# Patient Record
Sex: Male | Born: 1985 | Race: White | Hispanic: No | Marital: Single | Smoking: Former smoker
Health system: Southern US, Community
[De-identification: ages and names within clinical notes are randomized; demographics above are authoritative.]

## PROBLEM LIST (undated history)

## (undated) DIAGNOSIS — F419 Anxiety disorder, unspecified: Secondary | ICD-10-CM

## (undated) DIAGNOSIS — E079 Disorder of thyroid, unspecified: Secondary | ICD-10-CM

## (undated) HISTORY — DX: Disorder of thyroid, unspecified: E07.9

## (undated) HISTORY — DX: Anxiety disorder, unspecified: F41.9

## (undated) HISTORY — PX: FRACTURE SURGERY: SHX138

---
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
## 2011-03-17 ENCOUNTER — Emergency Department: Payer: Self-pay | Admitting: Emergency Medicine

## 2011-03-17 ENCOUNTER — Ambulatory Visit: Payer: Self-pay

## 2011-03-18 ENCOUNTER — Emergency Department: Payer: Self-pay | Admitting: *Deleted

## 2011-03-19 ENCOUNTER — Ambulatory Visit: Payer: Self-pay

## 2015-03-16 ENCOUNTER — Encounter: Payer: Self-pay | Admitting: *Deleted

## 2015-03-16 ENCOUNTER — Emergency Department: Payer: BLUE CROSS/BLUE SHIELD

## 2015-03-16 ENCOUNTER — Emergency Department
Admission: EM | Admit: 2015-03-16 | Discharge: 2015-03-16 | Disposition: A | Discharge status: Home / Self Care | Payer: BLUE CROSS/BLUE SHIELD | Attending: Emergency Medicine | Admitting: Emergency Medicine

## 2015-03-16 DIAGNOSIS — R1909 Other intra-abdominal and pelvic swelling, mass and lump: Secondary | ICD-10-CM

## 2015-03-16 DIAGNOSIS — L089 Local infection of the skin and subcutaneous tissue, unspecified: Secondary | ICD-10-CM | POA: Insufficient documentation

## 2015-03-16 DIAGNOSIS — R1031 Right lower quadrant pain: Secondary | ICD-10-CM | POA: Insufficient documentation

## 2015-03-16 DIAGNOSIS — R1900 Intra-abdominal and pelvic swelling, mass and lump, unspecified site: Secondary | ICD-10-CM | POA: Insufficient documentation

## 2015-03-16 DIAGNOSIS — B9689 Other specified bacterial agents as the cause of diseases classified elsewhere: Secondary | ICD-10-CM

## 2015-03-16 LAB — BASIC METABOLIC PANEL
Anion gap: 6 (ref 5–15)
BUN: 18 mg/dL (ref 6–20)
CHLORIDE: 101 mmol/L (ref 101–111)
CO2: 30 mmol/L (ref 22–32)
Calcium: 9.1 mg/dL (ref 8.9–10.3)
Creatinine, Ser: 0.81 mg/dL (ref 0.61–1.24)
GFR calc Af Amer: >60 mL/min (ref >60)
GFR calc non Af Amer: >60 mL/min (ref >60)
GLUCOSE: 99 mg/dL (ref 65–99)
POTASSIUM: 3.6 mmol/L (ref 3.5–5.1)
SODIUM: 137 mmol/L (ref 135–145)

## 2015-03-16 LAB — CBC WITH DIFFERENTIAL/PLATELET
Basophils Absolute: 0 10*3/uL (ref 0–0.1)
Basophils Relative: 0 %
EOS PCT: 1 %
Eosinophils Absolute: 0.1 10*3/uL (ref 0–0.7)
HEMATOCRIT: 36.8 % — AB (ref 40.0–52.0)
Hemoglobin: 12.7 g/dL — ABNORMAL LOW (ref 13.0–18.0)
LYMPHS ABS: 1.8 10*3/uL (ref 1.0–3.6)
LYMPHS PCT: 14 %
MCH: 29.7 pg (ref 26.0–34.0)
MCHC: 34.4 g/dL (ref 32.0–36.0)
MCV: 86.1 fL (ref 80.0–100.0)
Monocytes Absolute: 1.2 10*3/uL — ABNORMAL HIGH (ref 0.2–1.0)
Monocytes Relative: 9 %
Neutro Abs: 9.8 10*3/uL — ABNORMAL HIGH (ref 1.4–6.5)
Neutrophils Relative %: 76 %
PLATELETS: 176 10*3/uL (ref 150–440)
RBC: 4.28 MIL/uL — AB (ref 4.40–5.90)
RDW: 12.6 % (ref 11.5–14.5)
WBC: 12.9 10*3/uL — AB (ref 3.8–10.6)

## 2015-03-16 LAB — URINALYSIS COMPLETE WITH MICROSCOPIC (ARMC ONLY)
Bilirubin Urine: NEGATIVE
Glucose, UA: 50 mg/dL — AB
Leukocytes, UA: NEGATIVE
Nitrite: NEGATIVE
PH: 5 (ref 5.0–8.0)
Protein, ur: 30 mg/dL — AB
SPECIFIC GRAVITY, URINE: 1.032 — AB (ref 1.005–1.030)
SQUAMOUS EPITHELIAL / LPF: NONE SEEN

## 2015-03-16 MED ORDER — HYDROMORPHONE HCL 1 MG/ML IJ SOLN
1.0000 mg | Freq: Once | INTRAMUSCULAR | Status: AC
  Administered 2015-03-16: 1 mg via INTRAVENOUS

## 2015-03-16 MED ORDER — CLINDAMYCIN HCL 150 MG PO CAPS: 150.0000 mg | ORAL_CAPSULE | Freq: Four times a day (QID) | ORAL | Status: DC

## 2015-03-16 MED ORDER — OXYCODONE-ACETAMINOPHEN 7.5-325 MG PO TABS: 1.0000 | ORAL_TABLET | Freq: Four times a day (QID) | ORAL | Status: DC | PRN

## 2015-03-16 MED ORDER — SULFAMETHOXAZOLE-TRIMETHOPRIM 800-160 MG PO TABS
1.0000 | ORAL_TABLET | Freq: Two times a day (BID) | ORAL | Status: DC
Start: 2015-03-16 — End: 2015-03-16

## 2015-03-16 MED ORDER — CLINDAMYCIN PHOSPHATE 600 MG/50ML IV SOLN
600.0000 mg | Freq: Once | INTRAVENOUS | Status: AC
  Administered 2015-03-16: 600 mg via INTRAVENOUS
  Filled 2015-03-16: qty 50

## 2015-03-16 MED ORDER — HYDROMORPHONE HCL 1 MG/ML IJ SOLN
INTRAMUSCULAR | Status: AC
  Administered 2015-03-16: 1 mg via INTRAVENOUS
  Filled 2015-03-16: qty 1

## 2015-03-16 MED ORDER — IOHEXOL 300 MG/ML  SOLN
100.0000 mL | Freq: Once | INTRAMUSCULAR | Status: AC | PRN
  Administered 2015-03-16: 100 mL via INTRAVENOUS
  Filled 2015-03-16: qty 100

## 2015-03-16 NOTE — Discharge Instructions (Signed)
Take medication and follow discharge care instructions.

## 2015-03-16 NOTE — ED Notes (Signed)
Patient c/o abscess in right groin for two days.
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
## 2015-03-16 NOTE — ED Notes (Signed)
Pt presents with abscess to upper pubic area. States that it came up on Friday morning and then he "messed with it." Pt alert & oriented with warm, dry skin.

## 2015-03-18 ENCOUNTER — Encounter: Payer: Self-pay | Admitting: Emergency Medicine

## 2015-03-18 ENCOUNTER — Emergency Department
Admission: EM | Admit: 2015-03-18 | Discharge: 2015-03-18 | Disposition: A | Discharge status: Home / Self Care | Payer: BLUE CROSS/BLUE SHIELD | Attending: Emergency Medicine | Admitting: Emergency Medicine

## 2015-03-18 DIAGNOSIS — Z792 Long term (current) use of antibiotics: Secondary | ICD-10-CM | POA: Diagnosis not present

## 2015-03-18 DIAGNOSIS — L03314 Cellulitis of groin: Secondary | ICD-10-CM | POA: Diagnosis not present

## 2015-03-18 DIAGNOSIS — Z4801 Encounter for change or removal of surgical wound dressing: Secondary | ICD-10-CM | POA: Diagnosis present

## 2015-03-18 MED ORDER — OXYCODONE-ACETAMINOPHEN 5-325 MG PO TABS: 1.0000 | ORAL_TABLET | Freq: Four times a day (QID) | ORAL | Status: DC | PRN

## 2015-03-18 NOTE — ED Notes (Addendum)
Pt here to have inguinal abscess reassessed. Pt has been taking antibiotics and pain meds as prescribed.
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
## 2015-03-18 NOTE — Discharge Instructions (Signed)

## 2015-03-18 NOTE — ED Notes (Signed)
Discussed discharge instructions, prescriptions, and follow-up care with patient. No questions or concerns at this time. Pt stable at discharge.  

## 2016-07-04 ENCOUNTER — Inpatient Hospital Stay: Payer: BLUE CROSS/BLUE SHIELD

## 2016-07-04 ENCOUNTER — Inpatient Hospital Stay
Admission: EM | Admit: 2016-07-04 | Discharge: 2016-07-07 | DRG: 494 | Disposition: A | Discharge status: Home / Self Care | Payer: BLUE CROSS/BLUE SHIELD | Attending: Orthopedic Surgery | Admitting: Orthopedic Surgery

## 2016-07-04 ENCOUNTER — Emergency Department: Payer: BLUE CROSS/BLUE SHIELD

## 2016-07-04 DIAGNOSIS — S82851B Displaced trimalleolar fracture of right lower leg, initial encounter for open fracture type I or II: Secondary | ICD-10-CM | POA: Diagnosis present

## 2016-07-04 DIAGNOSIS — Z720 Tobacco use: Secondary | ICD-10-CM | POA: Diagnosis not present

## 2016-07-04 DIAGNOSIS — T148XXA Other injury of unspecified body region, initial encounter: Secondary | ICD-10-CM

## 2016-07-04 DIAGNOSIS — S82841B Displaced bimalleolar fracture of right lower leg, initial encounter for open fracture type I or II: Secondary | ICD-10-CM

## 2016-07-04 DIAGNOSIS — Z9889 Other specified postprocedural states: Secondary | ICD-10-CM

## 2016-07-04 DIAGNOSIS — Z419 Encounter for procedure for purposes other than remedying health state, unspecified: Secondary | ICD-10-CM

## 2016-07-04 DIAGNOSIS — Z8781 Personal history of (healed) traumatic fracture: Secondary | ICD-10-CM

## 2016-07-04 DIAGNOSIS — M25571 Pain in right ankle and joints of right foot: Secondary | ICD-10-CM | POA: Diagnosis present

## 2016-07-04 DIAGNOSIS — S82891B Other fracture of right lower leg, initial encounter for open fracture type I or II: Secondary | ICD-10-CM | POA: Diagnosis present

## 2016-07-04 LAB — TYPE AND SCREEN
ABO/RH(D): O POS
ANTIBODY SCREEN: NEGATIVE

## 2016-07-04 LAB — COMPREHENSIVE METABOLIC PANEL
ALBUMIN: 4.6 g/dL (ref 3.5–5.0)
ALT: 23 U/L (ref 17–63)
AST: 51 U/L — ABNORMAL HIGH (ref 15–41)
Alkaline Phosphatase: 45 U/L (ref 38–126)
Anion gap: 8 (ref 5–15)
BUN: 14 mg/dL (ref 6–20)
CHLORIDE: 103 mmol/L (ref 101–111)
CO2: 26 mmol/L (ref 22–32)
Calcium: 8.8 mg/dL — ABNORMAL LOW (ref 8.9–10.3)
Creatinine, Ser: 0.74 mg/dL (ref 0.61–1.24)
GFR calc Af Amer: >60 mL/min (ref >60)
Glucose, Bld: 100 mg/dL — ABNORMAL HIGH (ref 65–99)
POTASSIUM: 3.3 mmol/L — AB (ref 3.5–5.1)
SODIUM: 137 mmol/L (ref 135–145)
TOTAL PROTEIN: 7.5 g/dL (ref 6.5–8.1)
Total Bilirubin: 0.5 mg/dL (ref 0.3–1.2)

## 2016-07-04 LAB — CBC WITH DIFFERENTIAL/PLATELET
BASOS ABS: 0 10*3/uL (ref 0–0.1)
Basophils Relative: 0 %
EOS PCT: 0 %
Eosinophils Absolute: 0 10*3/uL (ref 0–0.7)
HEMATOCRIT: 38.7 % — AB (ref 40.0–52.0)
Hemoglobin: 13.5 g/dL (ref 13.0–18.0)
LYMPHS ABS: 1.5 10*3/uL (ref 1.0–3.6)
LYMPHS PCT: 11 %
MCH: 30.1 pg (ref 26.0–34.0)
MCHC: 34.8 g/dL (ref 32.0–36.0)
MCV: 86.4 fL (ref 80.0–100.0)
MONO ABS: 0.7 10*3/uL (ref 0.2–1.0)
Monocytes Relative: 5 %
NEUTROS ABS: 11.5 10*3/uL — AB (ref 1.4–6.5)
Neutrophils Relative %: 84 %
PLATELETS: 254 10*3/uL (ref 150–440)
RBC: 4.48 MIL/uL (ref 4.40–5.90)
RDW: 12.6 % (ref 11.5–14.5)
WBC: 13.8 10*3/uL — ABNORMAL HIGH (ref 3.8–10.6)

## 2016-07-04 LAB — PROTIME-INR
INR: 1.13
PROTHROMBIN TIME: 14.6 s (ref 11.4–15.2)

## 2016-07-04 MED ORDER — MORPHINE SULFATE (PF) 2 MG/ML IV SOLN
2.0000 mg | INTRAVENOUS | Status: DC | PRN
  Administered 2016-07-04 – 2016-07-05 (×4): 2 mg via INTRAVENOUS
  Filled 2016-07-04 (×4): qty 1

## 2016-07-04 MED ORDER — FENTANYL CITRATE (PF) 100 MCG/2ML IJ SOLN
50.0000 ug | INTRAMUSCULAR | Status: DC | PRN
  Administered 2016-07-04: 50 ug via NASAL
  Filled 2016-07-04: qty 2

## 2016-07-04 MED ORDER — MORPHINE SULFATE (PF) 4 MG/ML IV SOLN
4.0000 mg | Freq: Once | INTRAVENOUS | Status: AC
  Administered 2016-07-04: 4 mg via INTRAVENOUS
  Filled 2016-07-04: qty 1

## 2016-07-04 MED ORDER — SODIUM CHLORIDE 0.9 % IV BOLUS (SEPSIS)
1000.0000 mL | Freq: Once | INTRAVENOUS | Status: AC
Start: 2016-07-04 — End: 2016-07-04
  Administered 2016-07-04: 1000 mL via INTRAVENOUS

## 2016-07-04 MED ORDER — SODIUM CHLORIDE 0.9 % IV SOLN
INTRAVENOUS | Status: DC
  Administered 2016-07-05 (×3): via INTRAVENOUS

## 2016-07-04 MED ORDER — CEFAZOLIN IN D5W 1 GM/50ML IV SOLN
1.0000 g | Freq: Once | INTRAVENOUS | Status: AC
  Administered 2016-07-04: 1 g via INTRAVENOUS
  Filled 2016-07-04: qty 50

## 2016-07-04 MED ORDER — HYDROCODONE-ACETAMINOPHEN 5-325 MG PO TABS
1.0000 | ORAL_TABLET | Freq: Four times a day (QID) | ORAL | Status: DC | PRN
  Administered 2016-07-04 – 2016-07-06 (×6): 2 via ORAL
  Administered 2016-07-07: 1 via ORAL
  Filled 2016-07-04 (×8): qty 2

## 2016-07-04 MED ORDER — CEFAZOLIN SODIUM-DEXTROSE 2-4 GM/100ML-% IV SOLN
2.0000 g | Freq: Four times a day (QID) | INTRAVENOUS | Status: DC
  Filled 2016-07-04 (×3): qty 100

## 2016-07-04 NOTE — ED Notes (Signed)
Report given to floor RN, RN made aware of the bleeding from the cast and the pain and burning pt is describing
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
## 2016-07-04 NOTE — ED Notes (Signed)
Dr Rosita Kea called about the pain and amount of bleeding noted to the casting of the rt ankle, Dr Rosita Kea reports is normal for his injury and to apply ice to alleviate.
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
## 2016-07-05 ENCOUNTER — Encounter
Admission: EM | Disposition: A | Discharge status: Home / Self Care | Payer: Self-pay | Source: Home / Self Care | Attending: Orthopedic Surgery

## 2016-07-05 ENCOUNTER — Inpatient Hospital Stay: Payer: BLUE CROSS/BLUE SHIELD

## 2016-07-05 ENCOUNTER — Inpatient Hospital Stay: Payer: BLUE CROSS/BLUE SHIELD | Admitting: Anesthesiology

## 2016-07-05 HISTORY — PX: ORIF ANKLE FRACTURE: SHX5408

## 2016-07-05 HISTORY — PX: I & D EXTREMITY: SHX5045

## 2016-07-05 LAB — SURGICAL PCR SCREEN
MRSA, PCR: NEGATIVE
STAPHYLOCOCCUS AUREUS: POSITIVE — AB

## 2016-07-05 SURGERY — OPEN REDUCTION INTERNAL FIXATION (ORIF) ANKLE FRACTURE
Anesthesia: General | Laterality: Right

## 2016-07-05 MED ORDER — MIDAZOLAM HCL 2 MG/2ML IJ SOLN
INTRAMUSCULAR | Status: DC | PRN
  Administered 2016-07-05: 2 mg via INTRAVENOUS

## 2016-07-05 MED ORDER — ZOLPIDEM TARTRATE 5 MG PO TABS: 5.0000 mg | ORAL_TABLET | Freq: Every evening | ORAL | Status: DC | PRN

## 2016-07-05 MED ORDER — CEFAZOLIN SODIUM-DEXTROSE 2-3 GM-% IV SOLR
2.0000 g | Freq: Four times a day (QID) | INTRAVENOUS | Status: DC
  Filled 2016-07-05 (×4): qty 50

## 2016-07-05 MED ORDER — ONDANSETRON HCL 4 MG/2ML IJ SOLN
INTRAMUSCULAR | Status: DC | PRN
  Administered 2016-07-05 (×2): 4 mg via INTRAVENOUS

## 2016-07-05 MED ORDER — GENTAMICIN IN SALINE 1.6-0.9 MG/ML-% IV SOLN
80.0000 mg | Freq: Three times a day (TID) | INTRAVENOUS | Status: DC
  Filled 2016-07-05 (×3): qty 50

## 2016-07-05 MED ORDER — PROPOFOL 10 MG/ML IV BOLUS
INTRAVENOUS | Status: DC | PRN
  Administered 2016-07-05: 150 mg via INTRAVENOUS
  Administered 2016-07-05: 50 mg via INTRAVENOUS

## 2016-07-05 MED ORDER — METOCLOPRAMIDE HCL 10 MG PO TABS
5.0000 mg | ORAL_TABLET | Freq: Three times a day (TID) | ORAL | Status: DC | PRN
  Filled 2016-07-05: qty 1

## 2016-07-05 MED ORDER — OXYCODONE HCL 5 MG PO TABS
5.0000 mg | ORAL_TABLET | ORAL | Status: DC | PRN
  Administered 2016-07-06 – 2016-07-07 (×4): 10 mg via ORAL
  Filled 2016-07-05 (×4): qty 2

## 2016-07-05 MED ORDER — FENTANYL CITRATE (PF) 100 MCG/2ML IJ SOLN
INTRAMUSCULAR | Status: AC
  Filled 2016-07-05: qty 2

## 2016-07-05 MED ORDER — SODIUM CHLORIDE 0.9 % IV SOLN
INTRAVENOUS | Status: DC
  Administered 2016-07-05: 20:00:00 via INTRAVENOUS

## 2016-07-05 MED ORDER — KETOROLAC TROMETHAMINE 15 MG/ML IJ SOLN
15.0000 mg | Freq: Four times a day (QID) | INTRAMUSCULAR | Status: AC
  Administered 2016-07-05 – 2016-07-06 (×3): 15 mg via INTRAVENOUS
  Filled 2016-07-05 (×3): qty 1

## 2016-07-05 MED ORDER — DEXAMETHASONE SODIUM PHOSPHATE 10 MG/ML IJ SOLN
INTRAMUSCULAR | Status: DC | PRN
  Administered 2016-07-05: 10 mg via INTRAVENOUS

## 2016-07-05 MED ORDER — DEXTROSE 5 % IV SOLN
2.0000 g | Freq: Four times a day (QID) | INTRAVENOUS | Status: AC
  Administered 2016-07-05 – 2016-07-06 (×6): 2 g via INTRAVENOUS
  Filled 2016-07-05 (×7): qty 2000

## 2016-07-05 MED ORDER — ACETAMINOPHEN 650 MG RE SUPP: 650.0000 mg | Freq: Four times a day (QID) | RECTAL | Status: DC | PRN

## 2016-07-05 MED ORDER — SUGAMMADEX SODIUM 500 MG/5ML IV SOLN
INTRAVENOUS | Status: DC | PRN
  Administered 2016-07-05: 149.6 mg via INTRAVENOUS

## 2016-07-05 MED ORDER — HYDROMORPHONE HCL 1 MG/ML IJ SOLN
INTRAMUSCULAR | Status: AC
  Filled 2016-07-05: qty 1

## 2016-07-05 MED ORDER — ROCURONIUM BROMIDE 50 MG/5ML IV SOLN
INTRAVENOUS | Status: AC
  Filled 2016-07-05: qty 1

## 2016-07-05 MED ORDER — BISACODYL 5 MG PO TBEC: 5.0000 mg | DELAYED_RELEASE_TABLET | Freq: Every day | ORAL | Status: DC | PRN

## 2016-07-05 MED ORDER — ONDANSETRON HCL 4 MG/2ML IJ SOLN: 4.0000 mg | Freq: Four times a day (QID) | INTRAMUSCULAR | Status: DC | PRN

## 2016-07-05 MED ORDER — ACETAMINOPHEN 10 MG/ML IV SOLN
INTRAVENOUS | Status: DC | PRN
  Administered 2016-07-05: 1000 mg via INTRAVENOUS

## 2016-07-05 MED ORDER — DIPHENHYDRAMINE HCL 12.5 MG/5ML PO ELIX: 12.5000 mg | ORAL_SOLUTION | ORAL | Status: DC | PRN

## 2016-07-05 MED ORDER — ACETAMINOPHEN 10 MG/ML IV SOLN
INTRAVENOUS | Status: AC
  Filled 2016-07-05: qty 100

## 2016-07-05 MED ORDER — METHOCARBAMOL 500 MG PO TABS: 500.0000 mg | ORAL_TABLET | Freq: Four times a day (QID) | ORAL | Status: DC | PRN

## 2016-07-05 MED ORDER — HYDROMORPHONE HCL 1 MG/ML IJ SOLN
1.0000 mg | INTRAMUSCULAR | Status: DC | PRN
  Administered 2016-07-05 (×2): 1 mg via INTRAVENOUS
  Filled 2016-07-05 (×2): qty 1

## 2016-07-05 MED ORDER — DEXTROSE 5 % IV SOLN
2.0000 g | Freq: Four times a day (QID) | INTRAVENOUS | Status: DC
  Administered 2016-07-05 (×3): 2 g via INTRAVENOUS
  Filled 2016-07-05 (×7): qty 2000

## 2016-07-05 MED ORDER — METOCLOPRAMIDE HCL 5 MG/ML IJ SOLN: 5.0000 mg | Freq: Three times a day (TID) | INTRAMUSCULAR | Status: DC | PRN

## 2016-07-05 MED ORDER — ROCURONIUM BROMIDE 100 MG/10ML IV SOLN
INTRAVENOUS | Status: DC | PRN
  Administered 2016-07-05: 10 mg via INTRAVENOUS
  Administered 2016-07-05: 30 mg via INTRAVENOUS

## 2016-07-05 MED ORDER — PROPOFOL 10 MG/ML IV BOLUS
INTRAVENOUS | Status: AC
  Filled 2016-07-05: qty 20

## 2016-07-05 MED ORDER — ONDANSETRON HCL 4 MG/2ML IJ SOLN
INTRAMUSCULAR | Status: AC
  Filled 2016-07-05: qty 2

## 2016-07-05 MED ORDER — KETOROLAC TROMETHAMINE 30 MG/ML IJ SOLN
30.0000 mg | Freq: Once | INTRAMUSCULAR | Status: AC
  Administered 2016-07-05: 30 mg via INTRAVENOUS
  Filled 2016-07-05: qty 1

## 2016-07-05 MED ORDER — KETOROLAC TROMETHAMINE 30 MG/ML IJ SOLN
30.0000 mg | Freq: Four times a day (QID) | INTRAMUSCULAR | Status: DC | PRN
  Administered 2016-07-05: 30 mg via INTRAVENOUS
  Filled 2016-07-05 (×2): qty 1

## 2016-07-05 MED ORDER — ONDANSETRON HCL 4 MG PO TABS: 4.0000 mg | ORAL_TABLET | Freq: Four times a day (QID) | ORAL | Status: DC | PRN

## 2016-07-05 MED ORDER — HYDROMORPHONE HCL 1 MG/ML IJ SOLN
1.0000 mg | Freq: Once | INTRAMUSCULAR | Status: AC
  Administered 2016-07-05: 1 mg via INTRAVENOUS
  Filled 2016-07-05: qty 1

## 2016-07-05 MED ORDER — DEXAMETHASONE SODIUM PHOSPHATE 10 MG/ML IJ SOLN
INTRAMUSCULAR | Status: AC
  Filled 2016-07-05: qty 1

## 2016-07-05 MED ORDER — FENTANYL CITRATE (PF) 100 MCG/2ML IJ SOLN
INTRAMUSCULAR | Status: DC | PRN
  Administered 2016-07-05: 50 ug via INTRAVENOUS
  Administered 2016-07-05: 100 ug via INTRAVENOUS
  Administered 2016-07-05: 50 ug via INTRAVENOUS

## 2016-07-05 MED ORDER — SUCCINYLCHOLINE CHLORIDE 20 MG/ML IJ SOLN
INTRAMUSCULAR | Status: DC | PRN
  Administered 2016-07-05: 80 mg via INTRAVENOUS

## 2016-07-05 MED ORDER — MIDAZOLAM HCL 2 MG/2ML IJ SOLN
INTRAMUSCULAR | Status: AC
  Filled 2016-07-05: qty 2

## 2016-07-05 MED ORDER — SUGAMMADEX SODIUM 200 MG/2ML IV SOLN
INTRAVENOUS | Status: AC
  Filled 2016-07-05: qty 2

## 2016-07-05 MED ORDER — ACETAMINOPHEN 325 MG PO TABS: 650.0000 mg | ORAL_TABLET | Freq: Four times a day (QID) | ORAL | Status: DC | PRN

## 2016-07-05 MED ORDER — GENTAMICIN IN SALINE 1.6-0.9 MG/ML-% IV SOLN
80.0000 mg | Freq: Three times a day (TID) | INTRAVENOUS | Status: DC
  Administered 2016-07-05 – 2016-07-06 (×3): 80 mg via INTRAVENOUS
  Filled 2016-07-05 (×7): qty 50

## 2016-07-05 MED ORDER — CEFAZOLIN SODIUM-DEXTROSE 2-4 GM/100ML-% IV SOLN
2.0000 g | Freq: Four times a day (QID) | INTRAVENOUS | Status: DC
  Filled 2016-07-05 (×4): qty 100

## 2016-07-05 MED ORDER — DOCUSATE SODIUM 100 MG PO CAPS
100.0000 mg | ORAL_CAPSULE | Freq: Two times a day (BID) | ORAL | Status: DC
  Administered 2016-07-05 – 2016-07-06 (×3): 100 mg via ORAL
  Filled 2016-07-05 (×3): qty 1

## 2016-07-05 MED ORDER — MAGNESIUM CITRATE PO SOLN
1.0000 | Freq: Once | ORAL | Status: DC | PRN
  Filled 2016-07-05: qty 296

## 2016-07-05 MED ORDER — ONDANSETRON HCL 4 MG/2ML IJ SOLN: 4.0000 mg | Freq: Once | INTRAMUSCULAR | Status: DC | PRN

## 2016-07-05 MED ORDER — FENTANYL CITRATE (PF) 100 MCG/2ML IJ SOLN: 25.0000 ug | INTRAMUSCULAR | Status: DC | PRN

## 2016-07-05 MED ORDER — HYDROMORPHONE HCL 1 MG/ML IJ SOLN
INTRAMUSCULAR | Status: DC | PRN
  Administered 2016-07-05 (×4): 0.5 mg via INTRAVENOUS

## 2016-07-05 MED ORDER — LIDOCAINE HCL (CARDIAC) 20 MG/ML IV SOLN
INTRAVENOUS | Status: DC | PRN
  Administered 2016-07-05: 50 mg via INTRAVENOUS

## 2016-07-05 MED ORDER — METHOCARBAMOL 1000 MG/10ML IJ SOLN
500.0000 mg | Freq: Four times a day (QID) | INTRAVENOUS | Status: DC | PRN
  Filled 2016-07-05: qty 5

## 2016-07-05 MED ORDER — MAGNESIUM HYDROXIDE 400 MG/5ML PO SUSP
30.0000 mL | Freq: Every day | ORAL | Status: DC | PRN
  Administered 2016-07-05: 30 mL via ORAL
  Filled 2016-07-05: qty 30

## 2016-07-05 MED ORDER — HYDROMORPHONE HCL 1 MG/ML IJ SOLN: 1.0000 mg | INTRAMUSCULAR | Status: DC | PRN

## 2016-07-05 SURGICAL SUPPLY — 87 items
BANDAGE ACE 4X5 VEL STRL LF (GAUZE/BANDAGES/DRESSINGS) IMPLANT
BIT DRILL 2.5X2.75 QC CALB (BIT) ×2 IMPLANT
BIT DRILL 2.9 CANN QC NONSTRL (BIT) ×2 IMPLANT
BNDG ESMARK 4X12 TAN STRL LF (GAUZE/BANDAGES/DRESSINGS) ×2 IMPLANT
CANISTER SUCT 1200ML W/VALVE (MISCELLANEOUS) ×4 IMPLANT
CHLORAPREP W/TINT 26ML (MISCELLANEOUS) IMPLANT
CUFF TOURN 18 STER (MISCELLANEOUS) IMPLANT
CUFF TOURN 24 STER (MISCELLANEOUS) ×2 IMPLANT
CUFF TOURN 30 STER DUAL PORT (MISCELLANEOUS) IMPLANT
DRAPE C-ARM 42X70 (DRAPES) ×2 IMPLANT
DRAPE C-ARMOR (DRAPES) ×2 IMPLANT
DRAPE FLUOR MINI C-ARM 54X84 (DRAPES) IMPLANT
DRAPE INCISE IOBAN 66X45 STRL (DRAPES) IMPLANT
DRAPE SHEET LG 3/4 BI-LAMINATE (DRAPES) ×4 IMPLANT
DRAPE TABLE BACK 80X90 (DRAPES) ×2 IMPLANT
DRAPE U-SHAPE 47X51 STRL (DRAPES) ×2 IMPLANT
DRSG EMULSION OIL 3X8 NADH (GAUZE/BANDAGES/DRESSINGS) IMPLANT
DURAPREP 26ML APPLICATOR (WOUND CARE) IMPLANT
ELECT CAUTERY BLADE 6.4 (BLADE) ×2 IMPLANT
ELECT REM PT RETURN 9FT ADLT (ELECTROSURGICAL) ×2
ELECTRODE REM PT RTRN 9FT ADLT (ELECTROSURGICAL) ×1 IMPLANT
GAUZE PETRO XEROFOAM 1X8 (MISCELLANEOUS) ×2 IMPLANT
GAUZE SPONGE 4X4 12PLY STRL (GAUZE/BANDAGES/DRESSINGS) ×6 IMPLANT
GAUZE STRETCH 2X75IN STRL (MISCELLANEOUS) IMPLANT
GAUZE XEROFORM 4X4 STRL (GAUZE/BANDAGES/DRESSINGS) ×2 IMPLANT
GLOVE BIOGEL M STRL SZ7.5 (GLOVE) IMPLANT
GLOVE BIOGEL PI IND STRL 9 (GLOVE) ×2 IMPLANT
GLOVE BIOGEL PI INDICATOR 9 (GLOVE) ×2
GLOVE SURG SYN 9.0  PF PI (GLOVE) ×1
GLOVE SURG SYN 9.0 PF PI (GLOVE) ×1 IMPLANT
GOWN SRG 2XL LVL 4 RGLN SLV (GOWNS) ×1 IMPLANT
GOWN STRL NON-REIN 2XL LVL4 (GOWNS) ×1
GOWN STRL REUS W/ TWL LRG LVL3 (GOWN DISPOSABLE) ×1 IMPLANT
GOWN STRL REUS W/TWL LRG LVL3 (GOWN DISPOSABLE) ×1
HEMOVAC 400ML (MISCELLANEOUS)
K-WIRE ACE 1.6X6 (WIRE) ×6
KIT DRAIN HEMOVAC JP 7FR 400ML (MISCELLANEOUS) IMPLANT
KIT PREVENA INCISION MGT 13 (CANNISTER) ×2 IMPLANT
KIT RM TURNOVER STRD PROC AR (KITS) ×2 IMPLANT
KWIRE ACE 1.6X6 (WIRE) ×3 IMPLANT
LABEL OR SOLS (LABEL) IMPLANT
NDL SAFETY 25GX1.5 (NEEDLE) ×2 IMPLANT
NS IRRIG 1000ML POUR BTL (IV SOLUTION) ×2 IMPLANT
NS IRRIG 500ML POUR BTL (IV SOLUTION) IMPLANT
PACK EXTREMITY ARMC (MISCELLANEOUS) ×2 IMPLANT
PAD ABD DERMACEA PRESS 5X9 (GAUZE/BANDAGES/DRESSINGS) IMPLANT
PAD CAST CTTN 4X4 STRL (SOFTGOODS) IMPLANT
PAD PREP 24X41 OB/GYN DISP (PERSONAL CARE ITEMS) ×2 IMPLANT
PADDING CAST COTTON 4X4 STRL (SOFTGOODS)
PLATE ACE 100DEG 6HOLE (Plate) ×2 IMPLANT
PLATE LOCK 6H RT MED DIST TIB (Plate) ×2 IMPLANT
PULSAVAC PLUS IRRIG FAN TIP (DISPOSABLE) ×2
SCREW ACE CAN 4.0 16M (Screw) ×2 IMPLANT
SCREW ACE CAN 4.0 26M (Screw) IMPLANT
SCREW ACE CAN 4.0 34M (Screw) IMPLANT
SCREW ACE CAN 4.0 36M (Screw) ×2 IMPLANT
SCREW ACE CAN 4.0 55M (Screw) ×4 IMPLANT
SCREW CORTICAL 3.5MM  12MM (Screw) ×1 IMPLANT
SCREW CORTICAL 3.5MM  20MM (Screw) IMPLANT
SCREW CORTICAL 3.5MM  28MM (Screw) IMPLANT
SCREW CORTICAL 3.5MM  30MM (Screw) ×3 IMPLANT
SCREW CORTICAL 3.5MM  34MM (Screw) IMPLANT
SCREW CORTICAL 3.5MM 12MM (Screw) ×1 IMPLANT
SCREW CORTICAL 3.5MM 14MM (Screw) ×2 IMPLANT
SCREW CORTICAL 3.5MM 20MM (Screw) IMPLANT
SCREW CORTICAL 3.5MM 28MM (Screw) IMPLANT
SCREW CORTICAL 3.5MM 30MM (Screw) ×3 IMPLANT
SCREW CORTICAL 3.5MM 34MM (Screw) IMPLANT
SCREW CORTICAL 3.5MM 38MM (Screw) ×2 IMPLANT
SPLINT CAST 1 STEP 5X30 WHT (MISCELLANEOUS) IMPLANT
SPONGE LAP 18X18 5 PK (GAUZE/BANDAGES/DRESSINGS) IMPLANT
STAPLER SKIN PROX 35W (STAPLE) IMPLANT
STOCKINETTE STRL 6IN 960660 (GAUZE/BANDAGES/DRESSINGS) IMPLANT
STRAP SAFETY BODY (MISCELLANEOUS) IMPLANT
SUT ETHILON 3-0 FS-10 30 BLK (SUTURE)
SUT ETHILON 4-0 (SUTURE)
SUT ETHILON 4-0 FS2 18XMFL BLK (SUTURE)
SUT MNCRL AB 4-0 PS2 18 (SUTURE) IMPLANT
SUT VIC AB 0 CT1 36 (SUTURE) IMPLANT
SUT VIC AB 2-0 SH 27 (SUTURE) ×1
SUT VIC AB 2-0 SH 27XBRD (SUTURE) ×1 IMPLANT
SUT VIC AB 3-0 SH 27 (SUTURE)
SUT VIC AB 3-0 SH 27X BRD (SUTURE) IMPLANT
SUTURE EHLN 3-0 FS-10 30 BLK (SUTURE) IMPLANT
SUTURE ETHLN 4-0 FS2 18XMF BLK (SUTURE) IMPLANT
SYRINGE 10CC LL (SYRINGE) ×2 IMPLANT
TIP FAN IRRIG PULSAVAC PLUS (DISPOSABLE) ×1 IMPLANT

## 2016-07-05 NOTE — Care Management Note (Signed)
Case Management Note  Patient Details  Name: Shane Marshall MRN: [id number] Date of Birth: November 18, 1985  Subjective/Objective:  RNCM consult for equipment. ORIF and I & D today.                    Action/Plan: Following progression. Will assist as needed.   Expected Discharge Date:  07/07/16               Expected Discharge Plan:     In-House Referral:     Discharge planning Services  CM Consult  Post Acute Care Choice:  Durable Medical Equipment Choice offered to:     DME Arranged:    DME Agency:     HH Arranged:    HH Agency:     Status of Service:  In process, will continue to follow  If discussed at Long Length of Stay Meetings, dates discussed:    Additional Comments:  Marily Memos, RN 07/05/2016, 11:26 AM

## 2016-07-05 NOTE — Anesthesia Procedure Notes (Signed)
Procedure Name: Intubation Date/Time: 07/05/2016 2:31 PM Performed by: Omer Jack Pre-anesthesia Checklist: Patient identified, Patient being monitored, Timeout performed, Emergency Drugs available and Suction available Patient Re-evaluated:Patient Re-evaluated prior to inductionOxygen Delivery Method: Circle system utilized Preoxygenation: Pre-oxygenation with 100% oxygen Intubation Type: IV induction Ventilation: Mask ventilation without difficulty Laryngoscope Size: Miller and 2 Grade View: Grade I Tube type: Oral Tube size: 7.5 mm Number of attempts: 1 Placement Confirmation: ETT inserted through vocal cords under direct vision,  positive ETCO2 and breath sounds checked- equal and bilateral Secured at: 21 cm Tube secured with: Tape Dental Injury: Teeth and Oropharynx as per pre-operative assessment

## 2016-07-05 NOTE — Anesthesia Preprocedure Evaluation (Addendum)
Anesthesia Evaluation  Patient identified by MRN, date of birth, ID band Patient awake    Reviewed: Allergy & Precautions, NPO status , Patient's Chart, lab work & pertinent test results  History of Anesthesia Complications Negative for: history of anesthetic complications  Airway Mallampati: II       Dental   Pulmonary neg pulmonary ROS, Current Smoker (dips),           Cardiovascular negative cardio ROS       Neuro/Psych negative neurological ROS     GI/Hepatic negative GI ROS, Neg liver ROS,   Endo/Other  negative endocrine ROS  Renal/GU negative Renal ROS     Musculoskeletal   Abdominal   Peds  Hematology negative hematology ROS (+)   Anesthesia Other Findings   Reproductive/Obstetrics                            Anesthesia Physical Anesthesia Plan  ASA: II  Anesthesia Plan: General   Post-op Pain Management:    Induction:   Airway Management Planned: Oral ETT  Additional Equipment:   Intra-op Plan:   Post-operative Plan:   Informed Consent: I have reviewed the patients History and Physical, chart, labs and discussed the procedure including the risks, benefits and alternatives for the proposed anesthesia with the patient or authorized representative who has indicated his/her understanding and acceptance.     Plan Discussed with:   Anesthesia Plan Comments:         Anesthesia Quick Evaluation

## 2016-07-05 NOTE — Anesthesia Post-op Follow-up Note (Cosign Needed)
Anesthesia QCDR form completed.        

## 2016-07-05 NOTE — Progress Notes (Signed)
Having significant swelling to RLE,.  Loosened splint, no tight compression by splint. Able to move toes passively without severe pain. Plan ORIF later with I+D of small wound, probably with incisional wound vac post op.

## 2016-07-05 NOTE — Progress Notes (Signed)
Dr Rosita Kea notified of decreased movement in pt toes. RN instructed to loosen the ace wrap. Will continue to monitor.

## 2016-07-05 NOTE — Progress Notes (Signed)
PTS. PAIN 10 OUT OF 10. Dr. Rosita Kea notified and ordered dilaudid  q3h prn

## 2016-07-05 NOTE — Progress Notes (Signed)
Dr Rosita Kea called for ongoing pain, new orders received. Pt R toes are cool to touch, color is appropriate, cap refill less than 3 seconds. Pt can wiggle right toes with decreased strength and has full sensation/pain. Lungs clear. Skin tear to R elbow, foam applied. Pt updated on POC.

## 2016-07-05 NOTE — Op Note (Signed)
07/04/2016 - 07/05/2016  5:54 PM  PATIENT:  Shane Marshall  31 y.o. male  PRE-OPERATIVE DIAGNOSIS:  RIGHT ANKLE FRACTURE open trimalleolar  POST-OPERATIVE DIAGNOSIS:  RIGHT ANKLE FRACTURE  open trimalleolar  PROCEDURE:  Procedure(s): OPEN REDUCTION INTERNAL FIXATION (ORIF) ANKLE FRACTURE (Right) IRRIGATION AND DEBRIDEMENT EXTREMITY (Right)  SURGEON: Leitha Schuller, MD  ASSISTANTS: None  ANESTHESIA:   general  EBL:  Total I/O In: 800 [I.V.:800] Out: 25 [Blood:25]  BLOOD ADMINISTERED:none  DRAINS: none   LOCAL MEDICATIONS USED:  NONE  SPECIMEN:  No Specimen  DISPOSITION OF SPECIMEN:  N/A  COUNTS:  YES  TOURNIQUET:   Total Tourniquet Time Documented: Thigh (Right) - 99 minutes Total: Thigh (Right) - 99 minutes   IMPLANTS: Biomet locking medial tibial plate with a cannulated screws and cortical screws, posteriorly third tubular plate with multiple screws  DICTATION: .Dragon Dictation patient brought the operating room and after adequate general anesthesia was obtained the patient was placed prone. The leg was prepped and draped in sterile fashion after patient identification timeout procedures were completed the open area of the wound was extended proximally and distally proximally centimeter in each direction opening was less than a centimeter in size but there is a large muscle belly present that appeared to arise from the peroneal muscles this is debrided with use of a scalpel to get cut back to viable muscle at this point the wound was irrigated with pulsatile lavage using a dilute Betadine solution followed by set normal saline there was no gross contamination within the wound. The wound was left open and subsequently at the end of the case covered with a Provena wound VAC. Going posterior laterally about the approach was extended from a prior posterior lateral incision from a prior tibia fracture extended between the fibula and Achilles tendon with the peroneal tendon sheath  left intact and the tendons retracted laterally. The FHL was identified and elevated off the posterior aspect of the tibia with lateral views with track pressure on the distal fragment the joint appeared reduced well. A third tubular plate was then put in the appropriate position and then locked nonlocking screws were placed proximally with it and it was contoured slightly to allow for for additional compression on the distal fragment and with 3 cortical screws proximally near anatomic alignment was obtained. 2 cancellus screws were then placed through the plate directed medially and laterally that gave additional compression and essentially anatomical alignment of the articular surface on the lateral view. The attention was then returned to turned medially where the physician trimalleolar fracture was essentially also tibial distal tibia fracture with extension of the plafond with the medial malleolus also displaced Salter just putting a screw in the medial as would be inadequate and would not correct the varus deformity so a small incision was made at the level of the medial malleolus and a medial plate was slid subcutaneously under approximate 5 cm bruising Bruce skin which was left intact and not incised and a K wire inserted through the tip of this plate with a Cane screw through the medial malleolus to give some stability proximally "cortical screws were placed through the plate around the rod to get the plate close to the bone this is not entirely successful was the extensive hardware in the distal ankle and the prior rod as well as the prior interlocking screw The plate from getting all the way down to the bone but it did appear to have acceptable alignment and position after  the proximal screws were placed using technique and using lateral x-ray and going around the rod with cortical screw  PLA fracture appeared stable under stress views and the fibula was stable and so no fixation was applied to the  fibula at this point the tourniquet up and let down at slightly over 90 minutes and the wounds thoroughly irrigated. The wound was closed with 2-0 Vicryl subcutaneously and skin staples medial and lateral with a incisional wound VAC over the open wound which is left) left alone at the close the case with Xeroform 4 x 4's web roll and splint applied followed by an Ace wrap   N OF CARE: Continue in his inpatient  PATIENT DISPOSITION:  PACU - hemodynamically stable.

## 2016-07-06 ENCOUNTER — Encounter: Payer: Self-pay | Admitting: Orthopedic Surgery

## 2016-07-06 LAB — HIV ANTIBODY (ROUTINE TESTING W REFLEX): HIV SCREEN 4TH GENERATION: NONREACTIVE

## 2016-07-06 MED ORDER — HYDROCODONE-ACETAMINOPHEN 5-325 MG PO TABS: 1.0000 | ORAL_TABLET | Freq: Four times a day (QID) | ORAL | 0 refills | Status: DC | PRN

## 2016-07-06 MED ORDER — ASPIRIN EC 325 MG PO TBEC: 325.0000 mg | DELAYED_RELEASE_TABLET | Freq: Every day | ORAL | 0 refills | Status: DC

## 2016-07-06 MED ORDER — GENTAMICIN SULFATE 40 MG/ML IJ SOLN
80.0000 mg | Freq: Three times a day (TID) | INTRAVENOUS | Status: AC
  Administered 2016-07-06 – 2016-07-07 (×3): 80 mg via INTRAVENOUS
  Filled 2016-07-06 (×3): qty 2

## 2016-07-06 MED ORDER — POTASSIUM CHLORIDE 20 MEQ PO PACK
20.0000 meq | PACK | Freq: Two times a day (BID) | ORAL | Status: AC
  Administered 2016-07-06 (×2): 20 meq via ORAL
  Filled 2016-07-06 (×2): qty 1

## 2016-07-06 NOTE — Plan of Care (Signed)
Problem: Bowel/Gastric: Goal: Will not experience complications related to bowel motility Outcome: Progressing Pt is progressing toward all goals.   

## 2016-07-06 NOTE — Progress Notes (Signed)
Pt has received norco for increasing pain to his r leg. PIV #20 found to be occluded to pt's r fa, this was removed with catheter intact and this writer placed #22 to top of r hand on second attempt, pt tolerated well.

## 2016-07-06 NOTE — Care Management (Signed)
Crutches delivered by [facility]

## 2016-07-06 NOTE — Progress Notes (Signed)
Shift assessment completed by 0830. Pt resting in bed with r foot elevated. Pt's rle has gauze and ace wrap in place, teos with cap refill wnl and warm, unable to check pulses to rlw. LLe has ted and foot pump in place. Pt stated pain at assessment was bearable. piv #20 intact to rac, ivf is dc'd at assessment as pt has good po intake. Since assessment, pt has received oxycodone for pain after working with physical therapy, used crutches to ambulate. Pt was hopeful for d/c today but understands plan of care with continuing antibiotics until tomorrow. Pt has call bell in reach.

## 2016-07-06 NOTE — Evaluation (Signed)
Physical Therapy Evaluation Patient Details Name: Shane Marshall MRN: [id number] DOB: Jun 27, 1985 Today's Date: 07/06/2016   History of Present Illness  31 y/o male suffered R ankle fx.  He has had LE fxs in the past and feels confident using crutches, pt eager to go home but apparently he will need more IV antibiotics and will likely stay the night.  Clinical Impression  Pt did well with PT and was very confident with mobility and using crutches to ambulate.  He will be home alone during the day but feels confident with ability to manage and maintain NWBing/TTWBing while he is healing.  Pt did not show any limitations that require further PT intervention and will therefore PT orders will be completed.  Please consult if situation arises where he would need further work up, but at this time pt does not have any PT needs and should be able to manage at home w/o issue.     Follow Up Recommendations No PT follow up    Equipment Recommendations  Crutches (tall height)    Recommendations for Other Services       Precautions / Restrictions Precautions Precautions: Fall Restrictions RLE Weight Bearing: Touchdown weight bearing      Mobility  Bed Mobility Overal bed mobility: Independent                Transfers Overall transfer level: Independent Equipment used: Crutches             General transfer comment: Pt was actually able to stand w/o AD and maintain SLS on L, instructed on appropriate way to get up using   Ambulation/Gait Ambulation/Gait assistance: Independent Ambulation Distance (Feet): 100 Feet Assistive device: Crutches       General Gait Details: Pt with very confident use of crutches, he was able to swing through, showed good balance, excellent speed and did not require any cuing or assist.  Stairs            Wheelchair Mobility    Modified Rankin (Stroke Patients Only)       Balance Overall balance assessment: Independent (pt was able to  maintain L SLS w/o UEs)                                           Pertinent Vitals/Pain Pain Assessment:  (reports only minimal pain)    Home Living Family/patient expects to be discharged to:: Private residence Living Arrangements: Spouse/significant other     Home Access: Ramped entrance       Home Equipment: Crutches (standard height, needs tall crutches)      Prior Function Level of Independence: Independent         Comments: Pt works as heavy Arboriculturist, able to be active w/o limitations     Hand Dominance        Extremity/Trunk Assessment   Upper Extremity Assessment Upper Extremity Assessment: Overall WFL for tasks assessed    Lower Extremity Assessment Lower Extremity Assessment: Overall WFL for tasks assessed (except R ankle NT)       Communication   Communication: No difficulties  Cognition Arousal/Alertness: Awake/alert Behavior During Therapy: WFL for tasks assessed/performed Overall Cognitive Status: Within Functional Limits for tasks assessed  General Comments      Exercises     Assessment/Plan    PT Assessment Patent does not need any further PT services  PT Problem List         PT Treatment Interventions      PT Goals (Current goals can be found in the Care Plan section)  Acute Rehab PT Goals Patient Stated Goal: go home today PT Goal Formulation: All assessment and education complete, DC therapy    Frequency     Barriers to discharge        Co-evaluation               End of Session Equipment Utilized During Treatment: Gait belt Activity Tolerance: Patient tolerated treatment well Patient left: in bed;with bed alarm set Nurse Communication: Mobility status PT Visit Diagnosis: Difficulty in walking, not elsewhere classified (R26.2)    Time: 1610-9604 PT Time Calculation (min) (ACUTE ONLY): 14 min   Charges:   PT  Evaluation $PT Eval Low Complexity: 1 Procedure     PT G CodesMalachi Pro, DPT 07/06/2016, 10:17 AM

## 2016-07-06 NOTE — Progress Notes (Addendum)
   Subjective: 1 Day Post-Op Procedure(s) (LRB): OPEN REDUCTION INTERNAL FIXATION (ORIF) ANKLE FRACTURE (Right) IRRIGATION AND DEBRIDEMENT EXTREMITY (Right) Patient reports pain as mild.  Much improved from yesterday. Patient is well, and has had no acute complaints or problems Denies any CP, SOB, ABD pain. We will continue therapy today.  Plan is to go Home after hospital stay.  Objective: Vital signs in last 24 hours: Temp:  [97.7 F (36.5 C)-98.4 F (36.9 C)] 98.4 F (36.9 C) (04/10 0451) Pulse Rate:  [56-99] 62 (04/10 0451) Resp:  [12-23] 18 (04/10 0451) BP: (102-133)/(51-73) 103/51 (04/10 0451) SpO2:  [93 %-100 %] 100 % (04/10 0451)  Intake/Output from previous day: 04/09 0701 - 04/10 0700 In: 1916.3 [I.V.:1716.3; IV Piggyback:200] Out: 425 [Urine:400; Blood:25] Intake/Output this shift: No intake/output data recorded.   Recent Labs  07/04/16 2123  HGB 13.5    Recent Labs  07/04/16 2123  WBC 13.8*  RBC 4.48  HCT 38.7*  PLT 254    Recent Labs  07/04/16 2123  NA 137  K 3.3*  CL 103  CO2 26  BUN 14  CREATININE 0.74  GLUCOSE 100*  CALCIUM 8.8*    Recent Labs  07/04/16 2123  INR 1.13    EXAM General - Patient is Alert, Appropriate and Oriented Extremity - Neurovascular intact Sensation intact distally Intact pulses distally Dorsiflexion/Plantar flexion intact No cellulitis present Compartment soft Dressing - dressing C/D/I and no drainage, wound vac intact Motor Function - intact, moving toes well on exam.   History reviewed. No pertinent past medical history.  Assessment/Plan:   1 Day Post-Op Procedure(s) (LRB): OPEN REDUCTION INTERNAL FIXATION (ORIF) ANKLE FRACTURE (Right) IRRIGATION AND DEBRIDEMENT EXTREMITY (Right) Active Problems:   Ankle fracture, right, open type I or II, initial encounter  Estimated body mass index is 22.38 kg/m as calculated from the following:   Height as of this encounter: 6' (1.829 m).   Weight as of  this encounter: 74.8 kg (165 lb). Advance diet Up with therapy, TTWB RLE Continue with IV antibiotics today and tomorrow Plan on discharge to home tomorrow pending safe ambulation with PT and completion of IV abx. Follow up with [facility] on 07/12/16 for wound vac removal and wound check   DVT Prophylaxis - Aspirin Toe touch weight-Bearing right leg   T. Cranston Neighbor, PA-C [facility] 07/06/2016, 8:05 AM
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
## 2016-07-06 NOTE — Care Management Note (Addendum)
Case Management Note  Patient Details  Name: Shane Marshall MRN: [id number] Date of Birth: April 03, 1985  Subjective/Objective:   PT recommending no follow up.Spoke with Dr. Rosita Kea and is in agreement.  He has a follow up with Dr. Rosita Kea 04/16 for wound vac removal and wound check.                  Action/Plan: Ordered [facility]ches from [facility] It is anticipated that patient will discharge tomorrow. Patient agreeable to POC.   Expected Discharge Date:  07/07/16               Expected Discharge Plan:  Home/Self Care  In-House Referral:     Discharge planning Services  CM Consult  Post Acute Care Choice:  Durable Medical Equipment Choice offered to:  Patient  DME Arranged:  Crutches DME Agency:  [facility]  HH Arranged:    [facility] Agency:     Status of Service:  In process, will continue to follow  If discussed at Long Length of Stay Meetings, dates discussed:    Additional Comments:  Marily Memos, RN 07/06/2016, 9:05 AM

## 2016-07-06 NOTE — Progress Notes (Signed)
Clinical Child psychotherapist (CSW) received SNF consult. PT is recommending no follow up and tall crutches. RN case manager aware of above. Please reconsult if future social work needs arise. CSW signing off.   [employer], LCSW [phone]

## 2016-07-06 NOTE — Transfer of Care (Signed)
Immediate Anesthesia Transfer of Care Note  Patient: Shane Marshall  Procedure(s) Performed: Procedure(s): OPEN REDUCTION INTERNAL FIXATION (ORIF) ANKLE FRACTURE (Right) IRRIGATION AND DEBRIDEMENT EXTREMITY (Right)  Patient Location: PACU  Anesthesia Type:General  Level of Consciousness: sedated  Airway & Oxygen Therapy: Patient Spontanous Breathing and Patient connected to face mask oxygen  Post-op Assessment: Report given to RN  Post vital signs: Reviewed and stable  Last Vitals:  Vitals:   07/06/16 0201 07/06/16 0451  BP: 126/63 (!) 103/51  Pulse: 61 62  Resp: 18 18  Temp: 36.9 C 36.9 C    Last Pain:  Vitals:   07/06/16 0537  TempSrc:   PainSc: 5       Patients Stated Pain Goal: 3 (07/06/16 0437)  Complications: No apparent anesthesia complications

## 2016-07-06 NOTE — Discharge Instructions (Signed)
Diet: As you were doing prior to hospitalization   Shower:  May shower but keep the wounds dry, use an occlusive plastic wrap, NO SOAKING IN TUB.  If the bandage gets wet, change with a clean dry gauze.  Dressing:  Keep splint clean and dry.    Activity:  Increase activity slowly as tolerated, but follow the weight bearing instructions below.    Weight Bearing:   Toe touch weight bearing right leg  To prevent constipation: you may use a stool softener such as -  Colace (over the counter) 100 mg by mouth twice a day  Drink plenty of fluids (prune juice may be helpful) and high fiber foods Miralax (over the counter) for constipation as needed.    Itching:  If you experience itching with your medications, try taking only a single pain pill, or even half a pain pill at a time.  You may take up to 10 pain pills per day, and you can also use benadryl over the counter for itching or also to help with sleep.   Precautions:  If you experience chest pain or shortness of breath - call 911 immediately for transfer to the hospital emergency department!!  If you develop a fever greater that 101 F, purulent drainage from wound, increased redness or drainage from wound, or calf pain-Call [facility]                                               Follow- Up Appointment:  Please call for an appointment to be seen 07/12/16 at [facility]

## 2016-07-07 MED ORDER — AMOXICILLIN-POT CLAVULANATE 875-125 MG PO TABS: 1.0000 | ORAL_TABLET | Freq: Two times a day (BID) | ORAL | 0 refills | Status: AC

## 2016-07-07 MED ORDER — OXYCODONE HCL 5 MG PO TABS: 5.0000 mg | ORAL_TABLET | ORAL | 0 refills | Status: DC | PRN

## 2016-07-07 NOTE — Anesthesia Postprocedure Evaluation (Signed)
Anesthesia Post Note  Patient: Shane Marshall  Procedure(s) Performed: Procedure(s) (LRB): OPEN REDUCTION INTERNAL FIXATION (ORIF) ANKLE FRACTURE (Right) IRRIGATION AND DEBRIDEMENT EXTREMITY (Right)  Patient location during evaluation: PACU Anesthesia Type: General Level of consciousness: awake and alert and oriented Pain management: pain level controlled Vital Signs Assessment: post-procedure vital signs reviewed and stable Respiratory status: spontaneous breathing Cardiovascular status: blood pressure returned to baseline Anesthetic complications: no     Last Vitals:  Vitals:   07/06/16 2336 07/07/16 0803  BP: (!) 102/57 (!) 101/59  Pulse: 68 60  Resp: 16 15  Temp: 36.6 C     Last Pain:  Vitals:   07/07/16 0531  TempSrc:   PainSc: 6                  Myer Bohlman

## 2016-07-07 NOTE — Progress Notes (Signed)
This Clinical research associate removed piv from r wrist with catheter intact at 1200. Pt tolerated well. Pt received all d/c instructions after review, verbalized understanding of same. Pt escorted to front entrance of the facility at 1210 via wheelchair to waiting family car.

## 2016-07-07 NOTE — Care Management Note (Signed)
Case Management Note  Patient Details  Name: Shane Marshall MRN: [id number] Date of Birth: 25-Jul-1985  Subjective/Objective:  Discharging today                  Action/Plan: No further needs from [facility]  Expected Discharge Date:  07/07/16               Expected Discharge Plan:  Home/Self Care  In-House Referral:     Discharge planning Services  CM Consult  Post Acute Care Choice:  Durable Medical Equipment Choice offered to:  Patient  DME Arranged:  Crutches DME Agency:  [facility]  HH Arranged:    [facility] Agency:     Status of Service:  In process, will continue to follow  If discussed at Long Length of Stay Meetings, dates discussed:    Additional Comments:  Marily Memos, RN 07/07/2016, 8:53 AM

## 2016-07-07 NOTE — Progress Notes (Signed)
   Subjective: 2 Days Post-Op Procedure(s) (LRB): OPEN REDUCTION INTERNAL FIXATION (ORIF) ANKLE FRACTURE (Right) IRRIGATION AND DEBRIDEMENT EXTREMITY (Right) Patient reports pain as mild.   Patient is well, and has had no acute complaints or problems Denies any CP, SOB, ABD pain. We will continue therapy today.  Plan is to go Home after hospital stay.  Objective: Vital signs in last 24 hours: Temp:  [97.9 F (36.6 C)-98.7 F (37.1 C)] 97.9 F (36.6 C) (04/10 2336) Pulse Rate:  [60-68] 60 (04/11 0803) Resp:  [15-16] 15 (04/11 0803) BP: (101-111)/(57-59) 101/59 (04/11 0803) SpO2:  [99 %-100 %] 100 % (04/11 0803)  Intake/Output from previous day: 04/10 0701 - 04/11 0700 In: 220 [IV Piggyback:220] Out: 650 [Urine:650] Intake/Output this shift: No intake/output data recorded.   Recent Labs  07/04/16 2123  HGB 13.5    Recent Labs  07/04/16 2123  WBC 13.8*  RBC 4.48  HCT 38.7*  PLT 254    Recent Labs  07/04/16 2123  NA 137  K 3.3*  CL 103  CO2 26  BUN 14  CREATININE 0.74  GLUCOSE 100*  CALCIUM 8.8*    Recent Labs  07/04/16 2123  INR 1.13    EXAM General - Patient is Alert, Appropriate and Oriented Extremity - Neurovascular intact Sensation intact distally Intact pulses distally Dorsiflexion/Plantar flexion intact No cellulitis present Compartment soft Dressing - dressing C/D/I and no drainage, wound vac intact Motor Function - intact, moving toes well on exam.   History reviewed. No pertinent past medical history.  Assessment/Plan:   2 Days Post-Op Procedure(s) (LRB): OPEN REDUCTION INTERNAL FIXATION (ORIF) ANKLE FRACTURE (Right) IRRIGATION AND DEBRIDEMENT EXTREMITY (Right) Active Problems:   Ankle fracture, right, open type I or II, initial encounter  Estimated body mass index is 22.38 kg/m as calculated from the following:   Height as of this encounter: 6' (1.829 m).   Weight as of this encounter: 74.8 kg (165 lb). Advance diet Up  with therapy, TTWB RLE Discharge home today after last dose of IV abx Follow up with [facility] on 07/12/16 for wound vac removal and wound check   DVT Prophylaxis - Aspirin Toe touch weight-Bearing right leg   T. Cranston Neighbor, PA-C [facility] 07/07/2016, 8:04 AM

## 2016-07-07 NOTE — Plan of Care (Signed)
Problem: Bowel/Gastric: Goal: Will not experience complications related to bowel motility Outcome: Completed/Met Date Met: 07/07/16 Pt has met all goals for discharge.

## 2016-07-07 NOTE — Progress Notes (Signed)
Shift assessment completed at 0800, practitioner on rounds at that time as well. Pt alert and oriented, wife at bedside. Pt on room air, lungs clear bilat, Hr is regular, abdomen is soft, bs heard. Pt voiding in urinal, clear dark yellow urine emptied. R leg is elevated, gauze and ace wrap intact, provena woundvac intact as well. Pt has sensation to his toes and cap refill is wnl bilat. Pain at bearable level at that time, pt c/o pain at 1015 and received oxycodone po for this. Pt is willing for d/c today.

## 2016-08-30 ENCOUNTER — Other Ambulatory Visit: Payer: BLUE CROSS/BLUE SHIELD

## 2016-08-30 ENCOUNTER — Ambulatory Visit: Payer: BLUE CROSS/BLUE SHIELD | Admitting: Anesthesiology

## 2016-08-30 ENCOUNTER — Ambulatory Visit: Payer: BLUE CROSS/BLUE SHIELD

## 2016-08-30 ENCOUNTER — Encounter
Admission: RE | Disposition: A | Discharge status: Home / Self Care | Payer: Self-pay | Source: Ambulatory Visit | Attending: Orthopedic Surgery

## 2016-08-30 ENCOUNTER — Ambulatory Visit
Admission: RE | Admit: 2016-08-30 | Discharge: 2016-08-31 | Disposition: A | Discharge status: Home / Self Care | Payer: BLUE CROSS/BLUE SHIELD | Source: Ambulatory Visit | Attending: Orthopedic Surgery | Admitting: Orthopedic Surgery

## 2016-08-30 ENCOUNTER — Encounter: Payer: Self-pay | Admitting: *Deleted

## 2016-08-30 DIAGNOSIS — Z79899 Other long term (current) drug therapy: Secondary | ICD-10-CM | POA: Diagnosis not present

## 2016-08-30 DIAGNOSIS — Z8249 Family history of ischemic heart disease and other diseases of the circulatory system: Secondary | ICD-10-CM | POA: Diagnosis not present

## 2016-08-30 DIAGNOSIS — L0889 Other specified local infections of the skin and subcutaneous tissue: Secondary | ICD-10-CM | POA: Diagnosis not present

## 2016-08-30 DIAGNOSIS — Z720 Tobacco use: Secondary | ICD-10-CM | POA: Diagnosis not present

## 2016-08-30 DIAGNOSIS — Y838 Other surgical procedures as the cause of abnormal reaction of the patient, or of later complication, without mention of misadventure at the time of the procedure: Secondary | ICD-10-CM | POA: Diagnosis not present

## 2016-08-30 DIAGNOSIS — T814XXA Infection following a procedure, initial encounter: Secondary | ICD-10-CM | POA: Insufficient documentation

## 2016-08-30 DIAGNOSIS — Y818 Miscellaneous general- and plastic-surgery devices associated with adverse incidents, not elsewhere classified: Secondary | ICD-10-CM | POA: Diagnosis not present

## 2016-08-30 DIAGNOSIS — Z8781 Personal history of (healed) traumatic fracture: Secondary | ICD-10-CM

## 2016-08-30 DIAGNOSIS — Z7982 Long term (current) use of aspirin: Secondary | ICD-10-CM | POA: Diagnosis not present

## 2016-08-30 DIAGNOSIS — Z9889 Other specified postprocedural states: Secondary | ICD-10-CM

## 2016-08-30 DIAGNOSIS — Z419 Encounter for procedure for purposes other than remedying health state, unspecified: Secondary | ICD-10-CM

## 2016-08-30 DIAGNOSIS — T8140XA Infection following a procedure, unspecified, initial encounter: Secondary | ICD-10-CM | POA: Diagnosis present

## 2016-08-30 HISTORY — PX: HARDWARE REMOVAL: SHX979

## 2016-08-30 HISTORY — PX: I & D EXTREMITY: SHX5045

## 2016-08-30 SURGERY — IRRIGATION AND DEBRIDEMENT EXTREMITY
Anesthesia: General | Laterality: Right

## 2016-08-30 MED ORDER — CEFAZOLIN SODIUM-DEXTROSE 2-3 GM-% IV SOLR
INTRAVENOUS | Status: DC | PRN
  Administered 2016-08-30: 2 g via INTRAVENOUS

## 2016-08-30 MED ORDER — HYDROMORPHONE HCL 1 MG/ML IJ SOLN
0.5000 mg | INTRAMUSCULAR | Status: DC | PRN
  Administered 2016-08-30 – 2016-08-31 (×7): 1 mg via INTRAVENOUS
  Filled 2016-08-30 (×7): qty 1

## 2016-08-30 MED ORDER — BUPIVACAINE HCL (PF) 0.5 % IJ SOLN
INTRAMUSCULAR | Status: AC
  Filled 2016-08-30: qty 30

## 2016-08-30 MED ORDER — METOCLOPRAMIDE HCL 5 MG/ML IJ SOLN: 5.0000 mg | Freq: Three times a day (TID) | INTRAMUSCULAR | Status: DC | PRN

## 2016-08-30 MED ORDER — LACTATED RINGERS IV SOLN
Freq: Once | INTRAVENOUS | Status: AC
  Administered 2016-08-30: 12:00:00 via INTRAVENOUS

## 2016-08-30 MED ORDER — CEFAZOLIN SODIUM-DEXTROSE 2-4 GM/100ML-% IV SOLN
INTRAVENOUS | Status: AC
  Filled 2016-08-30: qty 100

## 2016-08-30 MED ORDER — PROPOFOL 10 MG/ML IV BOLUS
INTRAVENOUS | Status: DC | PRN
  Administered 2016-08-30: 160 mg via INTRAVENOUS
  Administered 2016-08-30: 30 mg via INTRAVENOUS

## 2016-08-30 MED ORDER — HYDROMORPHONE HCL 1 MG/ML IJ SOLN
INTRAMUSCULAR | Status: AC
  Filled 2016-08-30: qty 1

## 2016-08-30 MED ORDER — MIDAZOLAM HCL 2 MG/2ML IJ SOLN
INTRAMUSCULAR | Status: AC
  Filled 2016-08-30: qty 2

## 2016-08-30 MED ORDER — METOCLOPRAMIDE HCL 10 MG PO TABS: 5.0000 mg | ORAL_TABLET | Freq: Three times a day (TID) | ORAL | Status: DC | PRN

## 2016-08-30 MED ORDER — NEOMYCIN-POLYMYXIN B GU 40-200000 IR SOLN
Status: AC
  Filled 2016-08-30: qty 4

## 2016-08-30 MED ORDER — OXYCODONE HCL 5 MG PO TABS: 5.0000 mg | ORAL_TABLET | Freq: Once | ORAL | Status: DC | PRN

## 2016-08-30 MED ORDER — MEPERIDINE HCL 50 MG/ML IJ SOLN: 6.2500 mg | INTRAMUSCULAR | Status: DC | PRN

## 2016-08-30 MED ORDER — VANCOMYCIN HCL IN DEXTROSE 1-5 GM/200ML-% IV SOLN
1000.0000 mg | Freq: Two times a day (BID) | INTRAVENOUS | Status: AC
  Administered 2016-08-30: 1000 mg via INTRAVENOUS
  Filled 2016-08-30: qty 200

## 2016-08-30 MED ORDER — ROCURONIUM BROMIDE 50 MG/5ML IV SOLN
INTRAVENOUS | Status: AC
  Filled 2016-08-30: qty 1

## 2016-08-30 MED ORDER — MIDAZOLAM HCL 2 MG/2ML IJ SOLN
INTRAMUSCULAR | Status: DC | PRN
  Administered 2016-08-30: 2 mg via INTRAVENOUS

## 2016-08-30 MED ORDER — FENTANYL CITRATE (PF) 250 MCG/5ML IJ SOLN
INTRAMUSCULAR | Status: AC
  Filled 2016-08-30: qty 5

## 2016-08-30 MED ORDER — ONDANSETRON HCL 4 MG PO TABS: 4.0000 mg | ORAL_TABLET | Freq: Four times a day (QID) | ORAL | Status: DC | PRN

## 2016-08-30 MED ORDER — DOCUSATE SODIUM 100 MG PO CAPS
100.0000 mg | ORAL_CAPSULE | Freq: Two times a day (BID) | ORAL | Status: DC
  Administered 2016-08-30 – 2016-08-31 (×2): 100 mg via ORAL
  Filled 2016-08-30 (×2): qty 1

## 2016-08-30 MED ORDER — LIDOCAINE HCL (CARDIAC) 20 MG/ML IV SOLN
INTRAVENOUS | Status: DC | PRN
  Administered 2016-08-30: 80 mg via INTRAVENOUS

## 2016-08-30 MED ORDER — ONDANSETRON HCL 4 MG/2ML IJ SOLN
INTRAMUSCULAR | Status: AC
  Filled 2016-08-30: qty 2

## 2016-08-30 MED ORDER — NEOMYCIN-POLYMYXIN B GU 40-200000 IR SOLN
Status: DC | PRN
  Administered 2016-08-30: 4 mL

## 2016-08-30 MED ORDER — HYDROMORPHONE HCL 1 MG/ML IJ SOLN
0.5000 mg | INTRAMUSCULAR | Status: AC | PRN
  Administered 2016-08-30 (×4): 0.5 mg via INTRAVENOUS

## 2016-08-30 MED ORDER — OXYCODONE HCL 5 MG/5ML PO SOLN: 5.0000 mg | Freq: Once | ORAL | Status: DC | PRN

## 2016-08-30 MED ORDER — PROPOFOL 10 MG/ML IV BOLUS
INTRAVENOUS | Status: AC
  Filled 2016-08-30: qty 20

## 2016-08-30 MED ORDER — FENTANYL CITRATE (PF) 100 MCG/2ML IJ SOLN
INTRAMUSCULAR | Status: AC
  Filled 2016-08-30: qty 2

## 2016-08-30 MED ORDER — SENNOSIDES-DOCUSATE SODIUM 8.6-50 MG PO TABS: 1.0000 | ORAL_TABLET | Freq: Every evening | ORAL | Status: DC | PRN

## 2016-08-30 MED ORDER — FENTANYL CITRATE (PF) 100 MCG/2ML IJ SOLN
25.0000 ug | INTRAMUSCULAR | Status: DC | PRN
  Administered 2016-08-30 (×4): 50 ug via INTRAVENOUS

## 2016-08-30 MED ORDER — FENTANYL CITRATE (PF) 100 MCG/2ML IJ SOLN
INTRAMUSCULAR | Status: DC | PRN
  Administered 2016-08-30: 50 ug via INTRAVENOUS
  Administered 2016-08-30: 25 ug via INTRAVENOUS
  Administered 2016-08-30: 50 ug via INTRAVENOUS
  Administered 2016-08-30: 25 ug via INTRAVENOUS
  Administered 2016-08-30 (×2): 50 ug via INTRAVENOUS

## 2016-08-30 MED ORDER — SODIUM CHLORIDE 0.9 % IV SOLN
INTRAVENOUS | Status: DC
  Administered 2016-08-30: 1000 mL via INTRAVENOUS
  Administered 2016-08-30: 22:00:00 via INTRAVENOUS

## 2016-08-30 MED ORDER — PROMETHAZINE HCL 25 MG/ML IJ SOLN: 6.2500 mg | INTRAMUSCULAR | Status: DC | PRN

## 2016-08-30 MED ORDER — ONDANSETRON HCL 4 MG/2ML IJ SOLN: 4.0000 mg | Freq: Four times a day (QID) | INTRAMUSCULAR | Status: DC | PRN

## 2016-08-30 SURGICAL SUPPLY — 51 items
BNDG COHESIVE 4X5 TAN STRL (GAUZE/BANDAGES/DRESSINGS) ×2 IMPLANT
CANISTER SUCT 1200ML W/VALVE (MISCELLANEOUS) ×2 IMPLANT
CHLORAPREP W/TINT 10.5 ML (MISCELLANEOUS) ×2 IMPLANT
CHLORAPREP W/TINT 26ML (MISCELLANEOUS) ×2 IMPLANT
CUFF TOURN 18 STER (MISCELLANEOUS) IMPLANT
CUFF TOURN 24 STER (MISCELLANEOUS) IMPLANT
DRAPE C-ARM XRAY 36X54 (DRAPES) ×2 IMPLANT
DRAPE INCISE IOBAN 66X45 STRL (DRAPES) ×2 IMPLANT
DRSG EMULSION OIL 3X8 NADH (GAUZE/BANDAGES/DRESSINGS) ×2 IMPLANT
ELECT CAUTERY BLADE 6.4 (BLADE) ×2 IMPLANT
ELECT REM PT RETURN 9FT ADLT (ELECTROSURGICAL) ×2
ELECTRODE REM PT RTRN 9FT ADLT (ELECTROSURGICAL) ×1 IMPLANT
GAUZE PETRO XEROFOAM 1X8 (MISCELLANEOUS) ×2 IMPLANT
GAUZE SPONGE 4X4 12PLY STRL (GAUZE/BANDAGES/DRESSINGS) ×2 IMPLANT
GAUZE STRETCH 2X75IN STRL (MISCELLANEOUS) ×2 IMPLANT
GLOVE BIOGEL PI IND STRL 9 (GLOVE) ×1 IMPLANT
GLOVE BIOGEL PI INDICATOR 9 (GLOVE) ×1
GLOVE SURG SYN 9.0  PF PI (GLOVE) ×1
GLOVE SURG SYN 9.0 PF PI (GLOVE) ×1 IMPLANT
GOWN SRG 2XL LVL 4 RGLN SLV (GOWNS) ×1 IMPLANT
GOWN STRL NON-REIN 2XL LVL4 (GOWNS) ×1
GOWN STRL REUS W/ TWL LRG LVL3 (GOWN DISPOSABLE) ×1 IMPLANT
GOWN STRL REUS W/TWL LRG LVL3 (GOWN DISPOSABLE) ×1
GOWN STRL REUS W/TWL XL LVL4 (GOWN DISPOSABLE) ×2 IMPLANT
HANDLE YANKAUER SUCT BULB TIP (MISCELLANEOUS) ×2 IMPLANT
KIT PREVENA INCISION MGT 13 (CANNISTER) ×2 IMPLANT
KIT RM TURNOVER STRD PROC AR (KITS) ×2 IMPLANT
NEEDLE FILTER BLUNT 18X 1/2SAF (NEEDLE) ×1
NEEDLE FILTER BLUNT 18X1 1/2 (NEEDLE) ×1 IMPLANT
NS IRRIG 1000ML POUR BTL (IV SOLUTION) ×2 IMPLANT
NS IRRIG 500ML POUR BTL (IV SOLUTION) ×2 IMPLANT
PACK EXTREMITY ARMC (MISCELLANEOUS) ×2 IMPLANT
PACK HIP COMPR (MISCELLANEOUS) IMPLANT
PAD ABD DERMACEA PRESS 5X9 (GAUZE/BANDAGES/DRESSINGS) ×4 IMPLANT
PREP PVP WINGED SPONGE (MISCELLANEOUS) ×2 IMPLANT
SPONGE LAP 18X18 5 PK (GAUZE/BANDAGES/DRESSINGS) ×4 IMPLANT
STAPLER SKIN PROX 35W (STAPLE) ×2 IMPLANT
STOCKINETTE M/LG 89821 (MISCELLANEOUS) ×2 IMPLANT
STRAP SAFETY BODY (MISCELLANEOUS) ×2 IMPLANT
SUT ETHIBOND NAB CT1 #1 30IN (SUTURE) ×2 IMPLANT
SUT ETHILON 3-0 FS-10 30 BLK (SUTURE) ×2
SUT ETHILON 4-0 (SUTURE) ×1
SUT ETHILON 4-0 FS2 18XMFL BLK (SUTURE) ×1
SUT VIC AB 0 CT1 36 (SUTURE) ×2 IMPLANT
SUT VIC AB 1 CTX 27 (SUTURE) ×4 IMPLANT
SUT VIC AB 2-0 CT1 27 (SUTURE) ×1
SUT VIC AB 2-0 CT1 TAPERPNT 27 (SUTURE) ×1 IMPLANT
SUTURE EHLN 3-0 FS-10 30 BLK (SUTURE) ×1 IMPLANT
SUTURE ETHLN 4-0 FS2 18XMF BLK (SUTURE) ×1 IMPLANT
SYRINGE 10CC LL (SYRINGE) ×2 IMPLANT
WATER STERILE IRR 1000ML POUR (IV SOLUTION) ×2 IMPLANT

## 2016-08-30 NOTE — Anesthesia Procedure Notes (Addendum)
Procedure Name: LMA Insertion Date/Time: 08/30/2016 11:45 AM Performed by: Henrietta HooverPOPE, Gentry Seeber Pre-anesthesia Checklist: Patient identified, Emergency Drugs available, Suction available, Patient being monitored and Timeout performed Patient Re-evaluated:Patient Re-evaluated prior to inductionOxygen Delivery Method: Circle system utilized Preoxygenation: Pre-oxygenation with 100% oxygen Intubation Type: IV induction Ventilation: Mask ventilation without difficulty LMA: LMA inserted LMA Size: 4.0 Number of attempts: 1 Placement Confirmation: positive ETCO2 Tube secured with: Tape Dental Injury: Teeth and Oropharynx as per pre-operative assessment

## 2016-08-30 NOTE — Anesthesia Post-op Follow-up Note (Cosign Needed)
Anesthesia QCDR form completed.        

## 2016-08-30 NOTE — Op Note (Signed)
08/30/2016  12:50 PM  PATIENT:  Shane Marshall  31 y.o. male  PRE-OPERATIVE DIAGNOSIS:  S/P ORIF POST OP INFECTION  POST-OPERATIVE DIAGNOSIS:  status post ORIF right ankle fracture  PROCEDURE:  Procedure(s): IRRIGATION AND DEBRIDEMENT EXTREMITY (ANKLE) (Right) HARDWARE REMOVAL OF RIGHT MEDIAL TIBIA PLATE, 4 SCREWS (Right)  SURGEON: Leitha SchullerMichael J Jamika Sadek, MD  ASSISTANTS: None  ANESTHESIA:   general  EBL:  Total I/O In: 400 [I.V.:400] Out: 5 [Blood:5]  BLOOD ADMINISTERED:none  DRAINS: none   LOCAL MEDICATIONS USED:  NONE  SPECIMEN:  Source of Specimen:  Culture subcutaneous  DISPOSITION OF SPECIMEN:  micro- Biology lab  COUNTS:  YES  TOURNIQUET:   33 minutes at 300 mmHg   IMPLANTS: NONE  DICTATION: .Dragon Dictation patient brought the operating room and after adequate general anesthesia was obtained the right leg was prepped and draped in sterile fashion. After patient identification and timeout procedure were completed tourniquet was raised and the prior anterior medial skin incision over the medial malleolus was opened and subcutaneous culture taken up along the plate. Following this with C-arm helping to guide the 2 most proximal screws were removed from the plate and the plate then came out subcutaneously a third screw had gotten loose and was close to the medial malleolus and this was removed without difficulty. There was no thick pus there was a large amount of serosanguineous fluid and subcutaneous translator. After removal of the plate, the wounds were copiously irrigated with pulsatile lavage and the distal incision was then closed with skin staples. The remaining open wounds of which there were 4 left open these were areas that had been draining preoperatively and the areas of the prior screws and a incisional wound VAC was applied over this metastasis suction to help decrease the dead space postop and aid in wound healing.   PLAN OF CARE: Admit for overnight  observation  PATIENT DISPOSITION:  PACU - hemodynamically stable.

## 2016-08-30 NOTE — H&P (Signed)
Reviewed paper H+P, will be scanned into chart. No changes noted.  

## 2016-08-30 NOTE — Anesthesia Postprocedure Evaluation (Signed)
Anesthesia Post Note  Patient: Shane Marshall  Procedure(s) Performed: Procedure(s) (LRB): IRRIGATION AND DEBRIDEMENT EXTREMITY (ANKLE) (Right) HARDWARE REMOVAL OF RIGHT MEDIAL TIBIA PLATE, 4 SCREWS (Right)  Patient location during evaluation: PACU Anesthesia Type: General Level of consciousness: awake and alert and oriented Pain management: pain level controlled Vital Signs Assessment: post-procedure vital signs reviewed and stable Respiratory status: spontaneous breathing, nonlabored ventilation and respiratory function stable Cardiovascular status: blood pressure returned to baseline and stable Postop Assessment: no signs of nausea or vomiting Anesthetic complications: no     Last Vitals:  Vitals:   08/30/16 1325 08/30/16 1340  BP: 104/75 108/70  Pulse: 63 (!) 54  Resp: 12 10  Temp:      Last Pain:  Vitals:   08/30/16 1340  TempSrc:   PainSc: 6                  Rohan Juenger

## 2016-08-30 NOTE — Transfer of Care (Signed)
Immediate Anesthesia Transfer of Care Note  Patient: Shane Marshall  Procedure(s) Performed: Procedure(s): IRRIGATION AND DEBRIDEMENT EXTREMITY (ANKLE) (Right) HARDWARE REMOVAL OF RIGHT MEDIAL TIBIA PLATE, 4 SCREWS (Right)  Patient Location: PACU  Anesthesia Type:General  Level of Consciousness: awake  Airway & Oxygen Therapy: Patient Spontanous Breathing and Patient connected to face mask oxygen  Post-op Assessment: Report given to RN and Post -op Vital signs reviewed and stable  Post vital signs: Reviewed and stable  Last Vitals:  Vitals:   08/30/16 0957  BP: 123/81  Pulse: 67  Resp: 16  Temp: (!) 35.6 C    Last Pain:  Vitals:   08/30/16 0957  TempSrc: Tympanic  PainSc: 5       Patients Stated Pain Goal: 1 (08/30/16 0957)  Complications: No apparent anesthesia complications

## 2016-08-30 NOTE — Anesthesia Preprocedure Evaluation (Addendum)
Anesthesia Evaluation  Patient identified by MRN, date of birth, ID band Patient awake    Reviewed: Allergy & Precautions, NPO status , Patient's Chart, lab work & pertinent test results  History of Anesthesia Complications Negative for: history of anesthetic complications  Airway Mallampati: I  TM Distance: >3 FB Neck ROM: Full    Dental no notable dental hx.    Pulmonary neg sleep apnea, neg COPD,  Uses chewing tobacco   breath sounds clear to auscultation- rhonchi (-) wheezing      Cardiovascular Exercise Tolerance: Good (-) hypertension(-) CAD and (-) Past MI  Rhythm:Regular Rate:Normal - Systolic murmurs and - Diastolic murmurs    Neuro/Psych negative neurological ROS  negative psych ROS   GI/Hepatic negative GI ROS, Neg liver ROS,   Endo/Other  negative endocrine ROSneg diabetes  Renal/GU negative Renal ROS     Musculoskeletal negative musculoskeletal ROS (+)   Abdominal (+) - obese,   Peds  Hematology negative hematology ROS (+)   Anesthesia Other Findings   Reproductive/Obstetrics                            Anesthesia Physical Anesthesia Plan  ASA: II  Anesthesia Plan: General   Post-op Pain Management:    Induction: Intravenous  Airway Management Planned: LMA  Additional Equipment:   Intra-op Plan:   Post-operative Plan:   Informed Consent: I have reviewed the patients History and Physical, chart, labs and discussed the procedure including the risks, benefits and alternatives for the proposed anesthesia with the patient or authorized representative who has indicated his/her understanding and acceptance.   Dental advisory given  Plan Discussed with: CRNA and Anesthesiologist  Anesthesia Plan Comments:         Anesthesia Quick Evaluation

## 2016-08-31 DIAGNOSIS — T814XXA Infection following a procedure, initial encounter: Secondary | ICD-10-CM | POA: Diagnosis not present

## 2016-08-31 MED ORDER — SULFAMETHOXAZOLE-TRIMETHOPRIM 800-160 MG PO TABS: 1.0000 | ORAL_TABLET | Freq: Two times a day (BID) | ORAL | 0 refills | Status: DC

## 2016-08-31 MED ORDER — VANCOMYCIN HCL IN DEXTROSE 1-5 GM/200ML-% IV SOLN
1000.0000 mg | Freq: Two times a day (BID) | INTRAVENOUS | Status: AC
  Administered 2016-08-31: 1000 mg via INTRAVENOUS
  Filled 2016-08-31: qty 200

## 2016-08-31 MED ORDER — OXYCODONE-ACETAMINOPHEN 5-325 MG PO TABS
1.0000 | ORAL_TABLET | ORAL | Status: DC | PRN
Start: 2016-08-31 — End: 2016-08-31
  Administered 2016-08-31: 1 via ORAL
  Filled 2016-08-31: qty 1

## 2016-08-31 MED ORDER — OXYCODONE-ACETAMINOPHEN 5-325 MG PO TABS: 1.0000 | ORAL_TABLET | ORAL | 0 refills | Status: DC | PRN

## 2016-08-31 NOTE — Discharge Summary (Signed)
Physician Discharge Summary  Patient ID: Shane Marshall M Kuznia MRN: [id number] DOB/AGE: November 19, 1985 30 y.o.  Admit date: 08/30/2016 Discharge date: 08/31/2016  Admission Diagnoses:  S-P ORIF POST OP INFECTION   Discharge Diagnoses: Patient Active Problem List   Diagnosis Date Noted  . Postoperative infection 08/30/2016  . Ankle fracture, right, open type I or II, initial encounter 07/04/2016    History reviewed. No pertinent past medical history.   Transfusion: none   Consultants (if any):   Discharged Condition: Improved  Hospital Course: Shane Marshall M Defilippo is an 31 y.o. male who was admitted 08/30/2016 with a diagnosis of <principal problem not specified> and went to the operating room on 08/30/2016 and underwent the above named procedures.    Surgeries: Procedure(s): IRRIGATION AND DEBRIDEMENT EXTREMITY (ANKLE) HARDWARE REMOVAL OF RIGHT MEDIAL TIBIA PLATE, 4 SCREWS on 08/30/2016 Patient tolerated the surgery well. Taken to PACU where she was stabilized and then transferred to the orthopedic floor.  Started on IV vancomycin. Patient doing well post op day 1, VSS. Pain much improved.  On post op day #1patient was stable and ready for discharge to home with PO antibiotics.    He was given perioperative antibiotics:  Anti-infectives    Start     Dose/Rate Route Frequency Ordered Stop   08/31/16 0800  vancomycin (VANCOCIN) IVPB 1000 mg/200 mL premix     1,000 mg 200 mL/hr over 60 Minutes Intravenous Every 12 hours 08/31/16 0739 08/31/16 1959   08/31/16 0000  sulfamethoxazole-trimethoprim (BACTRIM DS,SEPTRA DS) 800-160 MG tablet     1 tablet Oral 2 times daily 08/31/16 0744     08/30/16 1415  vancomycin (VANCOCIN) IVPB 1000 mg/200 mL premix     1,000 mg 200 mL/hr over 60 Minutes Intravenous Every 12 hours 08/30/16 1414 08/30/16 1546    .  He was given sequential compression devices, early ambulation, and aspirin for DVT prophylaxis.  He benefited maximally from the hospital stay and there  were no complications.    Recent vital signs:  Vitals:   08/30/16 1340 08/30/16 2357  BP: 108/70 (!) 106/59  Pulse: (!) 54 62  Resp: 10 18  Temp:  98.3 F (36.8 C)    Recent laboratory studies:  Lab Results  Component Value Date   HGB 13.5 07/04/2016   HGB 12.7 (L) 03/16/2015   Lab Results  Component Value Date   WBC 13.8 (H) 07/04/2016   PLT 254 07/04/2016   Lab Results  Component Value Date   INR 1.13 07/04/2016   Lab Results  Component Value Date   NA 137 07/04/2016   K 3.3 (L) 07/04/2016   CL 103 07/04/2016   CO2 26 07/04/2016   BUN 14 07/04/2016   CREATININE 0.74 07/04/2016   GLUCOSE 100 (H) 07/04/2016    Discharge Medications:   Allergies as of 08/31/2016   No Known Allergies     Medication List    STOP taking these medications   HYDROcodone-acetaminophen 5-325 MG tablet Commonly known as:  NORCO/VICODIN     TAKE these medications   aspirin EC 325 MG tablet Take 1 tablet (325 mg total) by mouth daily.   oxyCODONE 5 MG immediate release tablet Commonly known as:  Oxy IR/ROXICODONE Take 1 tablet (5 mg total) by mouth every 4 (four) hours as needed for breakthrough pain.   oxyCODONE-acetaminophen 5-325 MG tablet Commonly known as:  PERCOCET/ROXICET Take 1 tablet by mouth every 4 (four) hours as needed for severe pain.   sulfamethoxazole-trimethoprim 800-160 MG tablet  Commonly known as:  BACTRIM DS,SEPTRA DS Take 1 tablet by mouth 2 (two) times daily.       Diagnostic Studies: Dg Ankle 2 Views Right  Result Date: 08/30/2016 CLINICAL DATA:  Removal medial right tibial plate with irrigation and debridement EXAM: RIGHT ANKLE - 2 VIEW COMPARISON:  07/05/2016 FINDINGS: The medial plate and screw fixation device seen previously along the medial tibia has been removed in the interval. Cannulated medial malleolar screw has also been removed. Gas in the overlying soft tissues is compatible with the recent surgery. IMPRESSION: Interval retrieval of medial  tibial plate and screw fixation device. Electronically Signed   By: Kennith Center M.D.   On: 08/30/2016 13:34   Dg Ankle 2 Views Right  Result Date: 08/30/2016 CLINICAL DATA:  Right ankle hardware removal. EXAM: RIGHT ANKLE - 2 VIEW COMPARISON:  Radiographs of July 05, 2016. FLUOROSCOPY TIME:  54 seconds. Radiation exposure index:  2.2 mGy. FINDINGS: Three intraoperative fluoroscopic images of the right ankle demonstrate surgical removal of fixation screws and plate from distal tibia. On 2 images, a loose screw is seen adjacent to the distal tibia. IMPRESSION: Surgical removal of fixation plate and screws from distal tibia. Electronically Signed   By: Lupita Raider, M.D.   On: 08/30/2016 12:31   Dg C-arm 1-60 Min-no Report  Result Date: 08/30/2016 Fluoroscopy was utilized by the requesting physician.  No radiographic interpretation.    Disposition: 01-Home or Self Care    Follow-up Information    Kennedy Bucker, MD Follow up in 7 day(s).   Specialty:  Orthopedic Surgery Contact information: [address] [place] [address] [phone]            Signed: Patience Musca 08/31/2016, 7:46 AM

## 2016-08-31 NOTE — Discharge Instructions (Signed)
Diet: As you were doing prior to hospitalization   Shower:  Keep right leg/dressing clean and dry  Dressing: Follow up with [facility] in 7 days for wound vac removal  Activity:  Increase activity slowly as tolerated, but follow the weight bearing instructions below.  No lifting or driving for 6 weeks.  Weight Bearing:   Non weight bearing right lower extremity  To prevent constipation: you may use a stool softener such as -  Colace (over the counter) 100 mg by mouth twice a day  Drink plenty of fluids (prune juice may be helpful) and high fiber foods Miralax (over the counter) for constipation as needed.    Itching:  If you experience itching with your medications, try taking only a single pain pill, or even half a pain pill at a time.  You may take up to 10 pain pills per day, and you can also use benadryl over the counter for itching or also to help with sleep.   Precautions:  If you experience chest pain or shortness of breath - call 911 immediately for transfer to the hospital emergency department!!  If you develop a fever greater that 101 F, purulent drainage from wound, increased redness or drainage from wound, or calf pain-Call [facility]                                               Follow- Up Appointment:  [facility]

## 2016-08-31 NOTE — Progress Notes (Signed)
Patient discharged with NPWT intact. Education and number provided for complications. Follow up appointment confirmed with patient. IV removed without discomfort. Prescriptions included with discharge education. Patient denies questions at this time. Discharge via w/c with significant other.

## 2016-08-31 NOTE — Progress Notes (Signed)
   Subjective: 1 Day Post-Op Procedure(s) (LRB): IRRIGATION AND DEBRIDEMENT EXTREMITY (ANKLE) (Right) HARDWARE REMOVAL OF RIGHT MEDIAL TIBIA PLATE, 4 SCREWS (Right) Patient reports pain as mild.   Patient is well, and has had no acute complaints or problems Denies any CP, SOB, ABD pain. Plan is to go Home after hospital stay.  Objective: Vital signs in last 24 hours: Temp:  [96.1 F (35.6 C)-98.9 F (37.2 C)] 98.3 F (36.8 C) (06/04 2357) Pulse Rate:  [54-84] 62 (06/04 2357) Resp:  [9-18] 18 (06/04 2357) BP: (104-128)/(59-91) 106/59 (06/04 2357) SpO2:  [100 %] 100 % (06/04 2357) Weight:  [74.8 kg (165 lb)] 74.8 kg (165 lb) (06/04 0957)  Intake/Output from previous day: 06/04 0701 - 06/05 0700 In: 1910 [P.O.:760; I.V.:950; IV Piggyback:200] Out: 505 [Urine:500; Blood:5] Intake/Output this shift: No intake/output data recorded.  No results for input(s): HGB in the last 72 hours. No results for input(s): WBC, RBC, HCT, PLT in the last 72 hours. No results for input(s): NA, K, CL, CO2, BUN, CREATININE, GLUCOSE, CALCIUM in the last 72 hours. No results for input(s): LABPT, INR in the last 72 hours.  EXAM General - Patient is Alert, Appropriate and Oriented Extremity - Neurovascular intact Sensation intact distally Intact pulses distally Dorsiflexion/Plantar flexion intact Compartment soft Dressing - dressing C/D/I, no drainage in wound vac Motor Function - intact, moving foot and toes well on exam.   History reviewed. No pertinent past medical history.  Assessment/Plan:   1 Day Post-Op Procedure(s) (LRB): IRRIGATION AND DEBRIDEMENT EXTREMITY (ANKLE) (Right) HARDWARE REMOVAL OF RIGHT MEDIAL TIBIA PLATE, 4 SCREWS (Right) Active Problems:   Postoperative infection  Estimated body mass index is 23.01 kg/m as calculated from the following:   Height as of this encounter: 5\' 11"  (1.803 m).   Weight as of this encounter: 74.8 kg (165 lb).  Discharge home today after  completion of IV antibiotics Discharge home with bactrim DS Follow up with [facility] in 7 days for wound vac removal NWB RLE   DVT Prophylaxis - Aspirin, Foot Pumps and TED hose Non weight bearing RLE   T. Cranston Neighborhris Gaines, PA-C [facility] 08/31/2016, 7:39 AM

## 2016-09-04 LAB — AEROBIC/ANAEROBIC CULTURE (SURGICAL/DEEP WOUND)

## 2016-09-04 LAB — AEROBIC/ANAEROBIC CULTURE W GRAM STAIN (SURGICAL/DEEP WOUND)

## 2018-01-19 DIAGNOSIS — Z0000 Encounter for general adult medical examination without abnormal findings: Secondary | ICD-10-CM | POA: Diagnosis not present

## 2018-02-10 IMAGING — XA DG ANKLE 2V *R*
3 series · 3 of 3 positions shown · non-contrast
Comparison: Radiographs July 05, 2016.

FLUOROSCOPY TIME:  54 seconds.

Radiation exposure index:  2.2 mGy.

CLINICAL DATA: Right ankle hardware removal.

EXAM:
RIGHT ANKLE - 2 VIEW

[Series 1: ortho standard · 1 of 1 slices shown (1 of 3)]
[im 1/1]
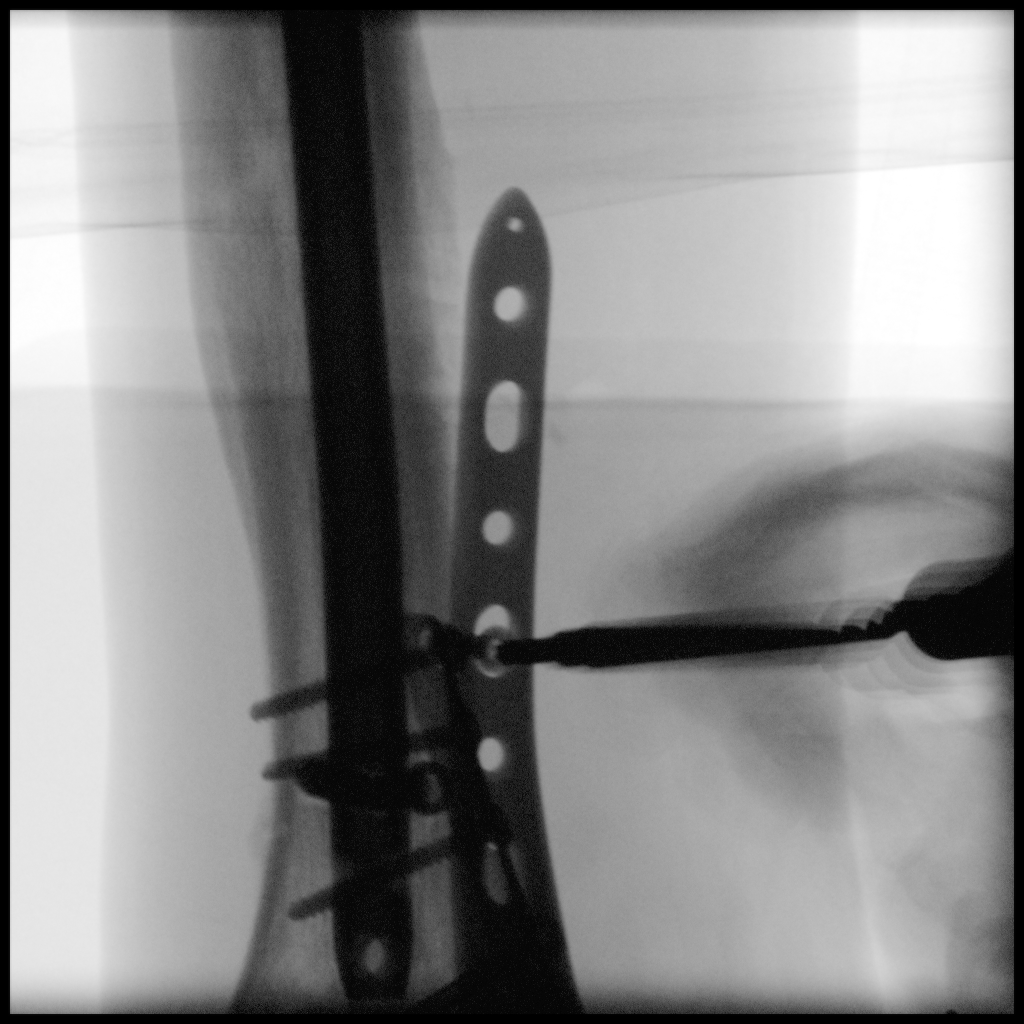

[Series 2: ortho standard · 1 of 1 slices shown (2 of 3)]
[im 1/1]
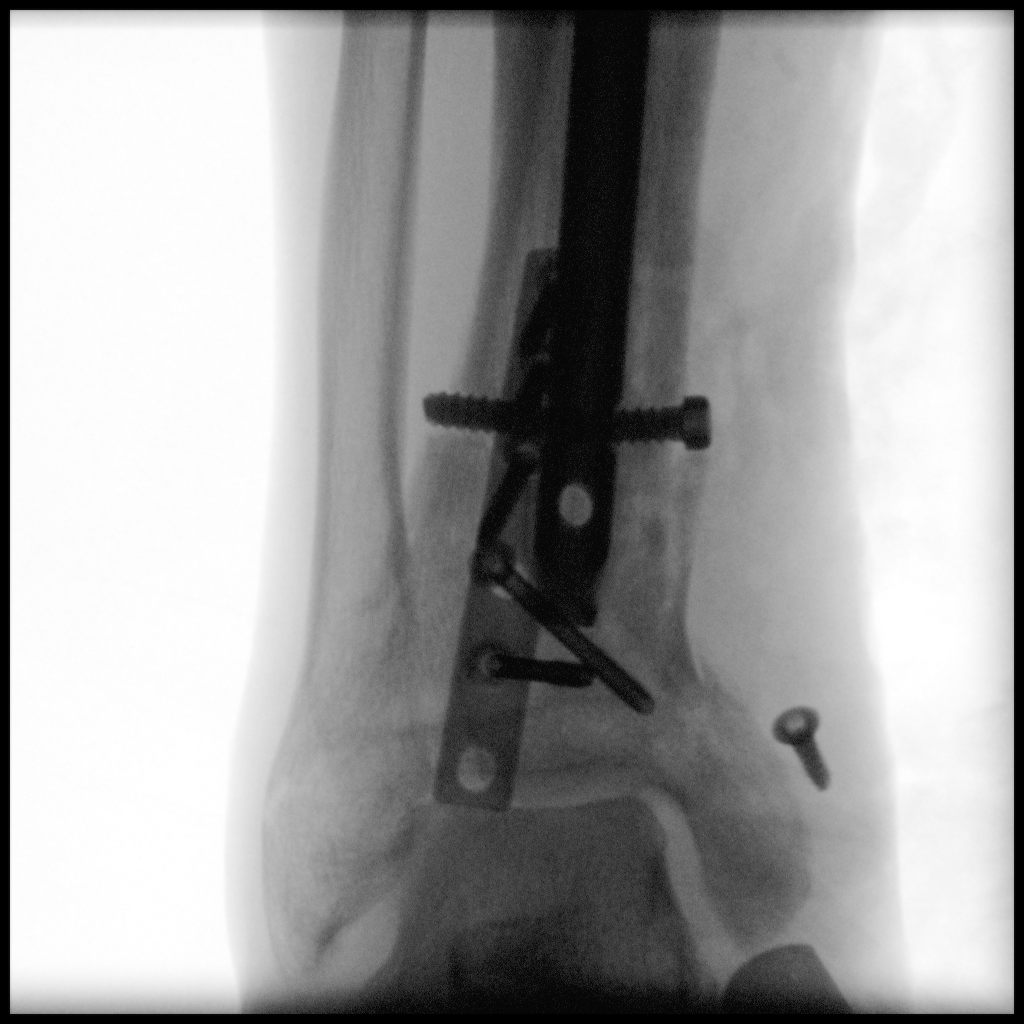

[Series 3: ortho standard · 1 of 1 slices shown (3 of 3)]
[im 1/1]
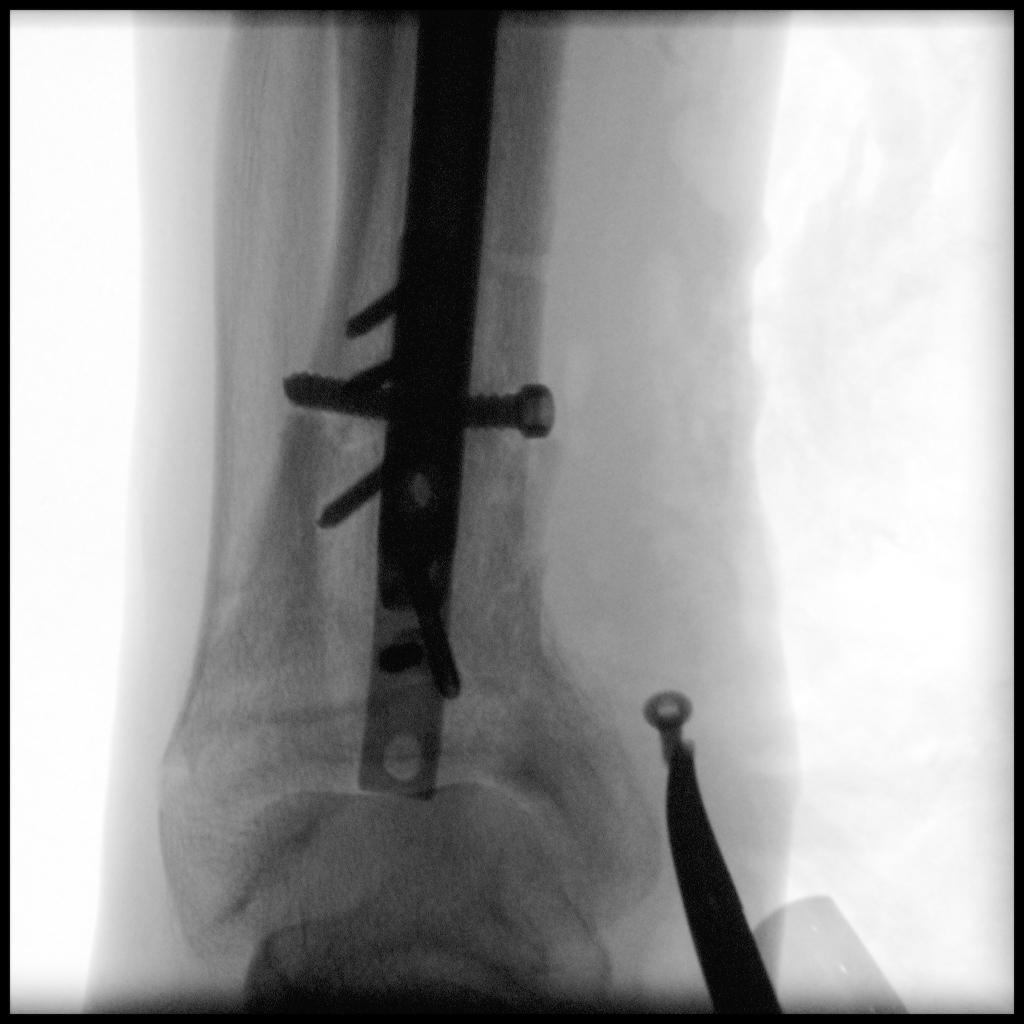

[3 of 3 positions shown; findings below may reference images not displayed]

FINDINGS: Three intraoperative fluoroscopic images of the right ankle
demonstrate surgical removal of fixation screws and plate from
distal tibia. On 2 images, a loose screw is seen adjacent to the
distal tibia.
IMPRESSION: Surgical removal of fixation plate and screws from distal tibia.

## 2018-02-10 IMAGING — DX DG ANKLE 2V *R*
2 series · 2 of 2 positions shown · non-contrast
Comparison: 07/05/2016

CLINICAL DATA: Removal medial right tibial plate with irrigation
and debridement

EXAM:
RIGHT ANKLE - 2 VIEW

[ankle ap (1 of 2)]
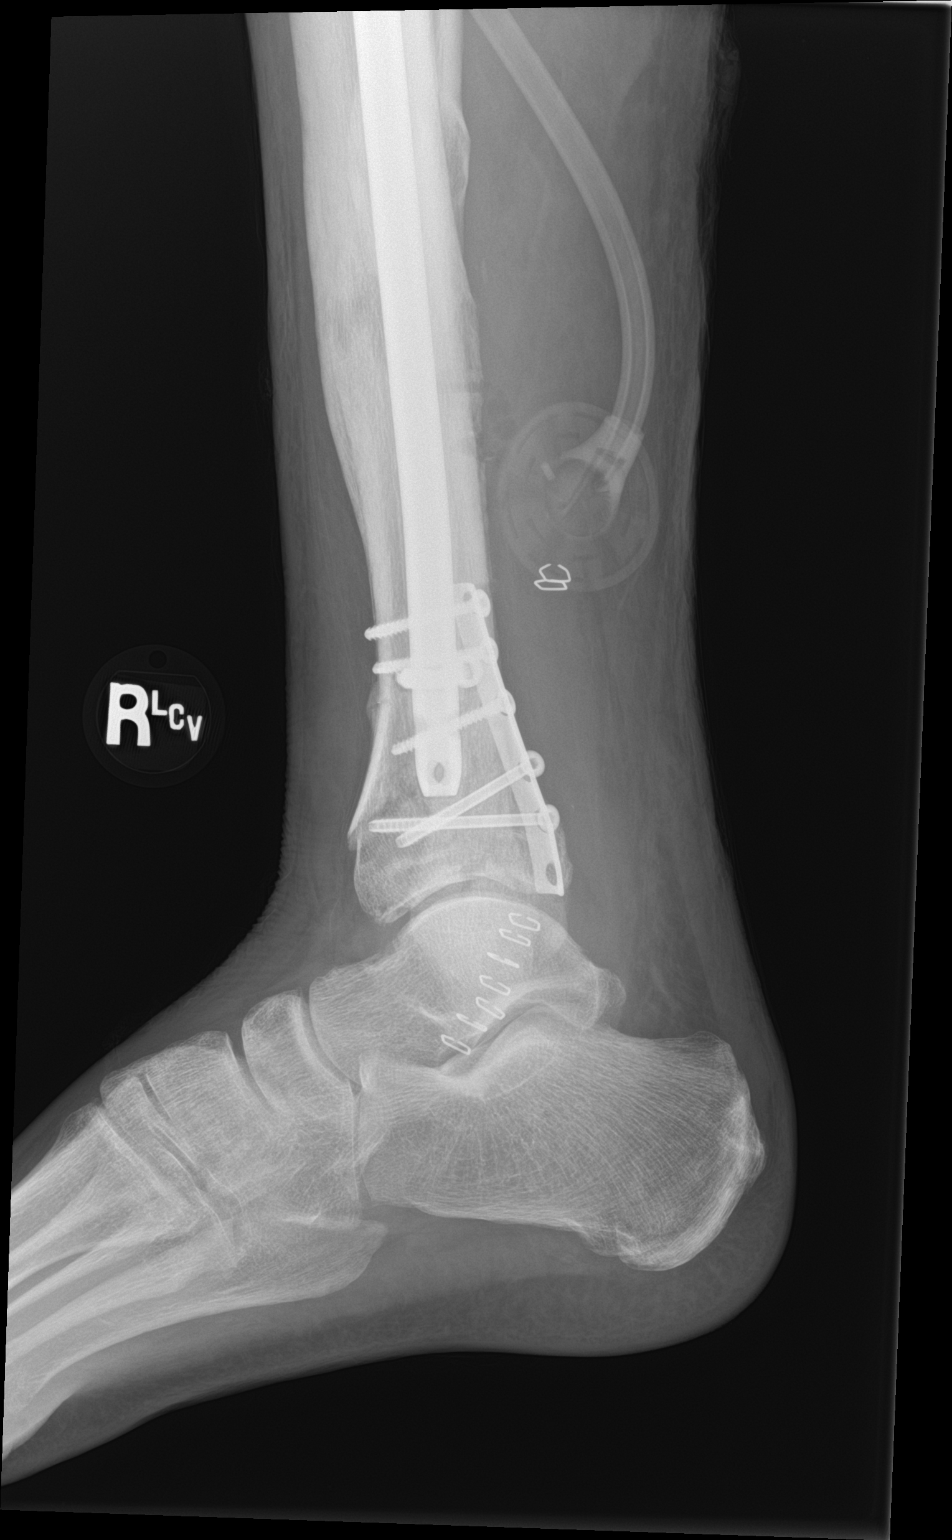

[ankle ap (2 of 2)]
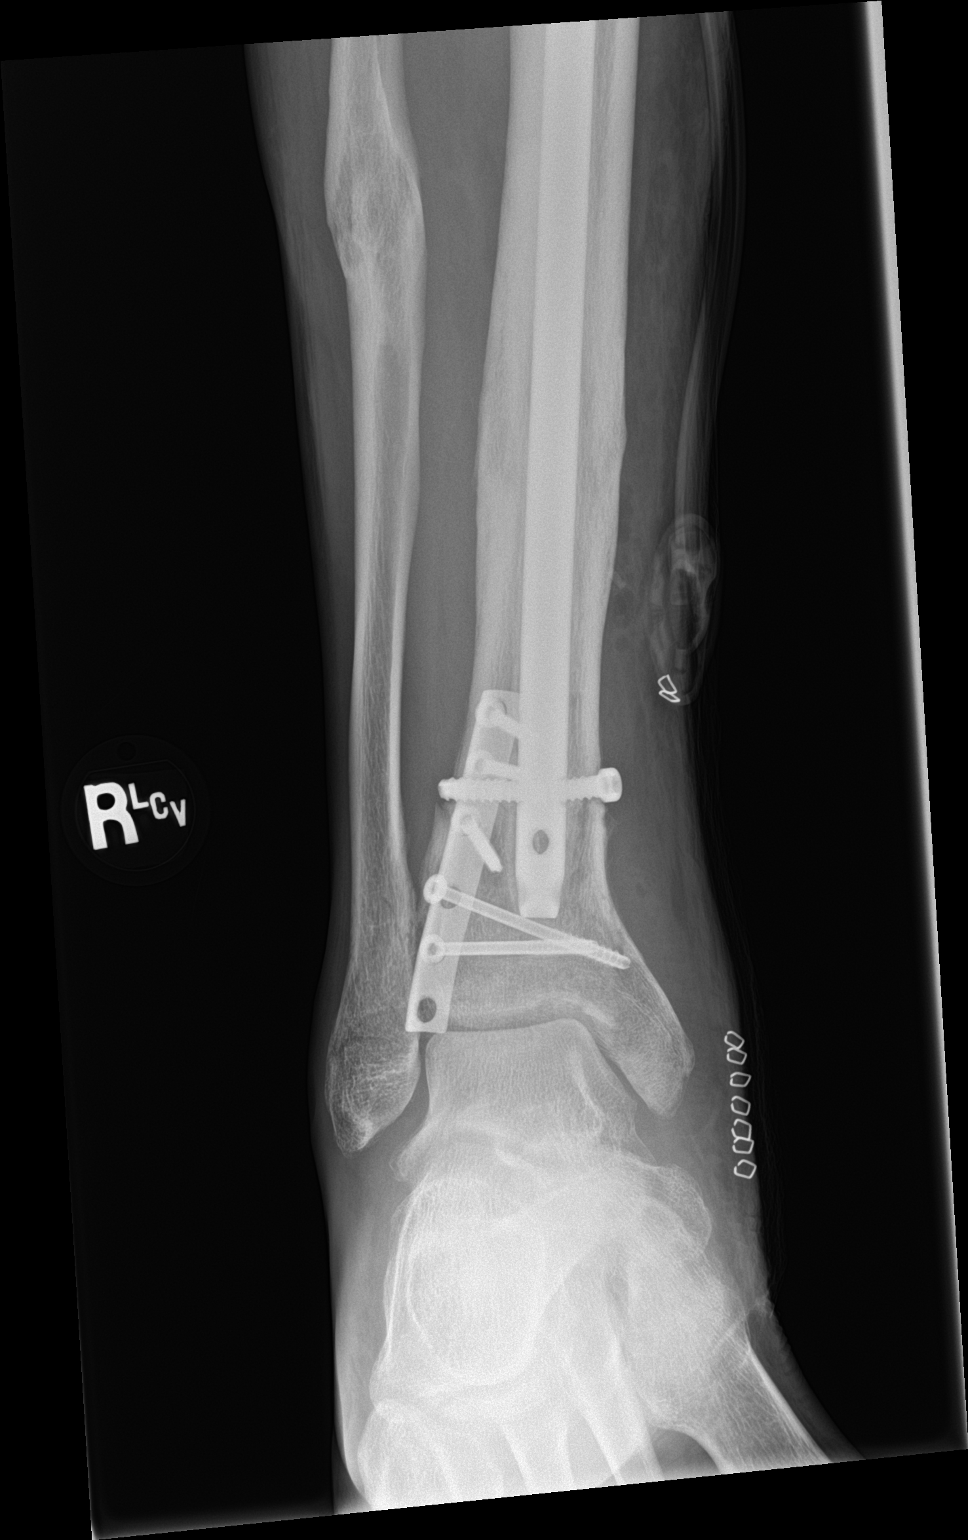

[2 of 2 positions shown; findings below may reference images not displayed]

FINDINGS: The medial plate and screw fixation device seen previously along the
medial tibia has been removed in the interval. Cannulated medial
malleolar screw has also been removed. Gas in the overlying soft
tissues is compatible with the recent surgery.
IMPRESSION: Interval retrieval of medial tibial plate and screw fixation device.

## 2018-12-10 DIAGNOSIS — Z20828 Contact with and (suspected) exposure to other viral communicable diseases: Secondary | ICD-10-CM | POA: Diagnosis not present

## 2018-12-13 DIAGNOSIS — Z20828 Contact with and (suspected) exposure to other viral communicable diseases: Secondary | ICD-10-CM | POA: Diagnosis not present

## 2019-02-20 DIAGNOSIS — Z0000 Encounter for general adult medical examination without abnormal findings: Secondary | ICD-10-CM | POA: Diagnosis not present

## 2019-03-18 DIAGNOSIS — Z20828 Contact with and (suspected) exposure to other viral communicable diseases: Secondary | ICD-10-CM | POA: Diagnosis not present

## 2019-03-20 DIAGNOSIS — I10 Essential (primary) hypertension: Secondary | ICD-10-CM | POA: Diagnosis not present

## 2019-03-20 DIAGNOSIS — E034 Atrophy of thyroid (acquired): Secondary | ICD-10-CM | POA: Diagnosis not present

## 2019-03-20 DIAGNOSIS — Z0000 Encounter for general adult medical examination without abnormal findings: Secondary | ICD-10-CM | POA: Diagnosis not present

## 2019-03-20 DIAGNOSIS — R5381 Other malaise: Secondary | ICD-10-CM | POA: Diagnosis not present

## 2019-03-20 DIAGNOSIS — E7849 Other hyperlipidemia: Secondary | ICD-10-CM | POA: Diagnosis not present

## 2019-07-04 DIAGNOSIS — Z1152 Encounter for screening for COVID-19: Secondary | ICD-10-CM | POA: Diagnosis not present

## 2020-01-31 DIAGNOSIS — U071 COVID-19: Secondary | ICD-10-CM | POA: Diagnosis not present

## 2020-03-07 ENCOUNTER — Ambulatory Visit (INDEPENDENT_AMBULATORY_CARE_PROVIDER_SITE_OTHER): Payer: BC Managed Care – PPO | Admitting: Internal Medicine

## 2020-03-07 ENCOUNTER — Other Ambulatory Visit: Payer: Self-pay

## 2020-03-07 DIAGNOSIS — Z0000 Encounter for general adult medical examination without abnormal findings: Secondary | ICD-10-CM

## 2020-03-08 LAB — LIPID PANEL
Cholesterol: 179 mg/dL (ref <200)
HDL: 57 mg/dL (ref >=40)
LDL Cholesterol (Calc): 105 mg/dL (calc) — ABNORMAL HIGH
Non-HDL Cholesterol (Calc): 122 mg/dL (calc) (ref <130)
Total CHOL/HDL Ratio: 3.1 (calc) (ref <5.0)
Triglycerides: 78 mg/dL (ref <150)

## 2020-03-08 LAB — CBC WITH DIFFERENTIAL/PLATELET
Absolute Monocytes: 437 cells/uL (ref 200–950)
Basophils Absolute: 22 cells/uL (ref 0–200)
Basophils Relative: 0.4 %
Eosinophils Absolute: 39 cells/uL (ref 15–500)
Eosinophils Relative: 0.7 %
HCT: 41.1 % (ref 38.5–50.0)
Hemoglobin: 14 g/dL (ref 13.2–17.1)
Lymphs Abs: 1697 cells/uL (ref 850–3900)
MCH: 29.3 pg (ref 27.0–33.0)
MCHC: 34.1 g/dL (ref 32.0–36.0)
MCV: 86 fL (ref 80.0–100.0)
MPV: 11.3 fL (ref 7.5–12.5)
Monocytes Relative: 7.8 %
Neutro Abs: 3405 cells/uL (ref 1500–7800)
Neutrophils Relative %: 60.8 %
Platelets: 217 10*3/uL (ref 140–400)
RBC: 4.78 10*6/uL (ref 4.20–5.80)
RDW: 13.3 % (ref 11.0–15.0)
Total Lymphocyte: 30.3 %
WBC: 5.6 10*3/uL (ref 3.8–10.8)

## 2020-03-08 LAB — COMPLETE METABOLIC PANEL WITH GFR
AG Ratio: 1.9 (calc) (ref 1.0–2.5)
ALT: 12 U/L (ref 9–46)
AST: 22 U/L (ref 10–40)
Albumin: 5 g/dL (ref 3.6–5.1)
Alkaline phosphatase (APISO): 68 U/L (ref 36–130)
BUN: 14 mg/dL (ref 7–25)
CO2: 25 mmol/L (ref 20–32)
Calcium: 9.8 mg/dL (ref 8.6–10.3)
Chloride: 103 mmol/L (ref 98–110)
Creat: 0.9 mg/dL (ref 0.60–1.35)
GFR, Est African American: 128 mL/min/{1.73_m2} (ref >=60)
GFR, Est Non African American: 110 mL/min/{1.73_m2} (ref >=60)
Globulin: 2.7 g/dL (calc) (ref 1.9–3.7)
Glucose, Bld: 94 mg/dL (ref 65–99)
Potassium: 3.9 mmol/L (ref 3.5–5.3)
Sodium: 138 mmol/L (ref 135–146)
Total Bilirubin: 0.8 mg/dL (ref 0.2–1.2)
Total Protein: 7.7 g/dL (ref 6.1–8.1)

## 2020-03-08 LAB — TSH: TSH: 1.99 mIU/L (ref 0.40–4.50)

## 2020-03-14 ENCOUNTER — Encounter: Payer: Self-pay | Admitting: Family Medicine

## 2020-03-14 ENCOUNTER — Ambulatory Visit (INDEPENDENT_AMBULATORY_CARE_PROVIDER_SITE_OTHER): Payer: BC Managed Care – PPO | Admitting: Family Medicine

## 2020-03-14 ENCOUNTER — Other Ambulatory Visit: Payer: Self-pay

## 2020-03-14 VITALS — BP 130/84 | HR 65 | Ht 72.0 in | Wt 173.4 lb

## 2020-03-14 DIAGNOSIS — Z0000 Encounter for general adult medical examination without abnormal findings: Secondary | ICD-10-CM | POA: Diagnosis not present

## 2020-03-14 NOTE — Progress Notes (Addendum)
Established Patient Office Visit  SUBJECTIVE:  Subjective  Patient ID: Shane Marshall, male    DOB: Feb 23, 1986  Age: 34 y.o. MRN: [id number]  CC:  Chief Complaint  Patient presents with  . Annual Exam    HPI Shane Marshall is a 34 y.o. male presenting today for Annual Exam  No past medical history on file.  Past Surgical History:  Procedure Laterality Date  . FRACTURE SURGERY     Right tib/fib 2014  . HARDWARE REMOVAL Right 08/30/2016   Procedure: HARDWARE REMOVAL OF RIGHT MEDIAL TIBIA PLATE, 4 SCREWS;  Surgeon: Kennedy Bucker, MD;  Location: [facility] ORS;  Service: Orthopedics;  Laterality: Right;  . I & D EXTREMITY Right 07/05/2016   Procedure: IRRIGATION AND DEBRIDEMENT EXTREMITY;  Surgeon: Kennedy Bucker, MD;  Location: [facility] ORS;  Service: Orthopedics;  Laterality: Right;  . I & D EXTREMITY Right 08/30/2016   Procedure: IRRIGATION AND DEBRIDEMENT EXTREMITY (ANKLE);  Surgeon: Kennedy Bucker, MD;  Location: [facility] ORS;  Service: Orthopedics;  Laterality: Right;  . ORIF ANKLE FRACTURE Right 07/05/2016   Procedure: OPEN REDUCTION INTERNAL FIXATION (ORIF) ANKLE FRACTURE;  Surgeon: Kennedy Bucker, MD;  Location: [facility] ORS;  Service: Orthopedics;  Laterality: Right;    No family history on file.  Social History   Socioeconomic History  . Marital status: Single    Spouse name: Not on file  . Number of children: Not on file  . Years of education: Not on file  . Highest education level: Not on file  Occupational History  . Not on file  Tobacco Use  . Smoking status: Former Games developer  . Smokeless tobacco: Current User    Types: Snuff  Substance and Sexual Activity  . Alcohol use: Yes    Comment: social  . Drug use: Not on file    Comment: dip smokeless tobacco  . Sexual activity: Not on file  Other Topics Concern  . Not on file  Social History Narrative  . Not on file   Social Determinants of Health   Financial Resource Strain: Not on file  Food Insecurity: Not on file  Transportation  Needs: Not on file  Physical Activity: Not on file  Stress: Not on file  Social Connections: Not on file  Intimate Partner Violence: Not on file    No current outpatient medications on file.   No Known Allergies  ROS Review of Systems  Constitutional: Negative.   HENT: Negative.   Respiratory: Negative.   Cardiovascular: Negative.   Musculoskeletal: Negative.   Neurological: Negative.   Psychiatric/Behavioral: Negative.      OBJECTIVE:    Physical Exam Vitals and nursing note reviewed.  Constitutional:      Appearance: Normal appearance.  HENT:     Head: Normocephalic and atraumatic.     Mouth/Throat:     Mouth: Mucous membranes are dry.  Cardiovascular:     Rate and Rhythm: Normal rate and regular rhythm.  Pulmonary:     Effort: Pulmonary effort is normal.     Breath sounds: Normal breath sounds.  Musculoskeletal:        General: Normal range of motion.     Cervical back: Normal range of motion.  Skin:    General: Skin is warm.  Neurological:     General: No focal deficit present.     Mental Status: He is alert.  Psychiatric:        Mood and Affect: Mood normal.     BP 130/84   Pulse  65   Ht 6' (1.829 m)   Wt 173 lb 6.4 oz (78.7 kg)   BMI 23.52 kg/m  Wt Readings from Last 3 Encounters:  03/14/20 173 lb 6.4 oz (78.7 kg)  08/30/16 165 lb (74.8 kg)  07/04/16 165 lb (74.8 kg)    Health Maintenance Due  Topic Date Due  . Hepatitis C Screening  Never done    There are no preventive care reminders to display for this patient.  CBC Latest Ref Rng & Units 03/07/2020 07/04/2016 03/16/2015  WBC 3.8 - 10.8 Thousand/uL 5.6 13.8(H) 12.9(H)  Hemoglobin 13.2 - 17.1 g/dL 27.5 17.0 12.7(L)  Hematocrit 38.5 - 50.0 % 41.1 38.7(L) 36.8(L)  Platelets 140 - 400 Thousand/uL 217 254 176   CMP Latest Ref Rng & Units 03/07/2020 07/04/2016 03/16/2015  Glucose 65 - 99 mg/dL 94 017(C) 99  BUN 7 - 25 mg/dL 14 14 18   Creatinine 0.60 - 1.35 mg/dL 9.44 9.67  Sodium 135  - 146 mmol/L 138 137 137  Potassium 3.5 - 5.3 mmol/L 3.9 3.3(L) 3.6  Chloride 98 - 110 mmol/L 103 103 101  CO2 20 - 32 mmol/L 25 26 30   Calcium 8.6 - 10.3 mg/dL 9.8 5.91) 9.1  Total Protein 6.1 - 8.1 g/dL 7.7 7.5 -  Total Bilirubin 0.2 - 1.2 mg/dL 0.8 0.5 -  Alkaline Phos 38 - 126 U/L - 45 -  AST 10 - 40 U/L 22 51(H) -  ALT 9 - 46 U/L 12 23 -    Lab Results  Component Value Date   TSH 1.99 03/07/2020   Lab Results  Component Value Date   ALBUMIN 4.6 07/04/2016   ANIONGAP 8 07/04/2016   Lab Results  Component Value Date   CHOL 179 03/07/2020   HDL 57 03/07/2020   LDLCALC 105 (H) 03/07/2020   CHOLHDL 3.1 03/07/2020   Lab Results  Component Value Date   TRIG 78 03/07/2020   No results found for: HGBA1C    ASSESSMENT & PLAN:   Problem List Items Addressed This Visit      Other   Annual physical exam - Primary    Colon Screening- NA PSA- NA TDAP- 2019 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  Shingles Vaccine- NA Flu Vaccine- Declined  Pneumonia Vaccine- NA           No orders of the defined types were placed in this encounter.     Follow-up: No follow-ups on file.    2020, FNP [facility] [address] [place], [address] [place]

## 2020-03-14 NOTE — Assessment & Plan Note (Signed)
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  Shingles Vaccine- NA Flu Vaccine- Declined  Pneumonia Vaccine- NA

## 2020-03-18 NOTE — Addendum Note (Signed)
Addended by: Irish Lack on: 03/18/2020 10:12 AM   Modules accepted: Level of Service

## 2020-10-29 ENCOUNTER — Ambulatory Visit: Payer: BC Managed Care – PPO | Admitting: Internal Medicine

## 2021-03-04 ENCOUNTER — Other Ambulatory Visit: Payer: Self-pay

## 2021-03-04 ENCOUNTER — Ambulatory Visit (INDEPENDENT_AMBULATORY_CARE_PROVIDER_SITE_OTHER): Payer: BC Managed Care – PPO | Admitting: Internal Medicine

## 2021-03-04 ENCOUNTER — Encounter: Payer: Self-pay | Admitting: Internal Medicine

## 2021-03-04 VITALS — BP 134/83 | HR 60 | Ht 72.0 in | Wt 175.0 lb

## 2021-03-04 DIAGNOSIS — S82891S Other fracture of right lower leg, sequela: Secondary | ICD-10-CM | POA: Diagnosis not present

## 2021-03-04 DIAGNOSIS — Z0000 Encounter for general adult medical examination without abnormal findings: Secondary | ICD-10-CM | POA: Diagnosis not present

## 2021-03-04 DIAGNOSIS — Z1159 Encounter for screening for other viral diseases: Secondary | ICD-10-CM

## 2021-03-04 DIAGNOSIS — S82891B Other fracture of right lower leg, initial encounter for open fracture type I or II: Secondary | ICD-10-CM

## 2021-03-04 NOTE — Assessment & Plan Note (Signed)
General physical examination is normal patient denies any chest pain or shortness of breath there is no paroxysmal nocturnal dyspnea, he does not smoke does not drink.  Heart is regular chest is clear abdominal soft nontender without any hepatosplenomegaly peripheral edema no calf tenderness rectal examination was performed.

## 2021-03-04 NOTE — Assessment & Plan Note (Addendum)
Patient ankle is much better there is no swelling

## 2021-03-04 NOTE — Progress Notes (Signed)
Established Patient Office Visit  Subjective:  Patient ID: Shane Marshall, male    DOB: 1985-04-10  Age: 35 y.o. MRN: [id number]  CC:  Chief Complaint  Patient presents with   Annual Exam    HPI  Shane Marshall presents for physical   Past Surgical History:  Procedure Laterality Date   FRACTURE SURGERY     Right tib/fib 2014   HARDWARE REMOVAL Right 08/30/2016   Procedure: HARDWARE REMOVAL OF RIGHT MEDIAL TIBIA PLATE, 4 SCREWS;  Surgeon: Kennedy Bucker, MD;  Location: [facility];  Service: Orthopedics;  Laterality: Right;   I & D EXTREMITY Right 07/05/2016   Procedure: IRRIGATION AND DEBRIDEMENT EXTREMITY;  Surgeon: Kennedy Bucker, MD;  Location: [facility];  Service: Orthopedics;  Laterality: Right;   I & D EXTREMITY Right 08/30/2016   Procedure: IRRIGATION AND DEBRIDEMENT EXTREMITY (ANKLE);  Surgeon: Kennedy Bucker, MD;  Location: [facility];  Service: Orthopedics;  Laterality: Right;   ORIF ANKLE FRACTURE Right 07/05/2016   Procedure: OPEN REDUCTION INTERNAL FIXATION (ORIF) ANKLE FRACTURE;  Surgeon: Kennedy Bucker, MD;  Location: [facility];  Service: Orthopedics;  Laterality: Right;    History reviewed. No pertinent family history.  Social History   Socioeconomic History   Marital status: Single    Spouse name: Not on file   Number of children: Not on file   Years of education: Not on file   Highest education level: Not on file  Occupational History   Not on file  Tobacco Use   Smoking status: Former   Smokeless tobacco: Current    Types: Snuff  Substance and Sexual Activity   Alcohol use: Yes    Comment: social   Drug use: Not on file    Comment: dip smokeless tobacco   Sexual activity: Not on file  Other Topics Concern   Not on file  Social History Narrative   Not on file   Social Determinants of Health   Financial Resource Strain: Not on file  Food Insecurity: Not on file  Transportation Needs: Not on file  Physical Activity: Not on file  Stress: Not on file  Social  Connections: Not on file  Intimate Partner Violence: Not on file    No current outpatient medications on file.   No Known Allergies  ROS Review of Systems  Constitutional: Negative.   HENT: Negative.    Eyes: Negative.   Respiratory: Negative.    Cardiovascular: Negative.   Gastrointestinal: Negative.   Endocrine: Negative.   Genitourinary: Negative.   Musculoskeletal: Negative.   Skin: Negative.   Allergic/Immunologic: Negative.   Neurological: Negative.   Hematological: Negative.   Psychiatric/Behavioral: Negative.    All other systems reviewed and are negative.    Objective:    Physical Exam Vitals reviewed.  Constitutional:      Appearance: Normal appearance.  HENT:     Mouth/Throat:     Mouth: Mucous membranes are moist.  Eyes:     Pupils: Pupils are equal, round, and reactive to light.  Neck:     Vascular: No carotid bruit.  Cardiovascular:     Rate and Rhythm: Normal rate and regular rhythm.     Pulses: Normal pulses.     Heart sounds: Normal heart sounds.  Pulmonary:     Effort: Pulmonary effort is normal.     Breath sounds: Normal breath sounds.  Abdominal:     General: Bowel sounds are normal.     Palpations: Abdomen is soft. There is no hepatomegaly,  splenomegaly or mass.     Tenderness: There is no abdominal tenderness.     Hernia: No hernia is present.  Musculoskeletal:     Cervical back: Neck supple.     Right lower leg: No edema.     Left lower leg: No edema.  Skin:    Findings: No rash.  Neurological:     Mental Status: He is alert and oriented to person, place, and time.     Motor: No weakness.  Psychiatric:        Mood and Affect: Mood normal.        Behavior: Behavior normal.    BP 134/83   Pulse 60   Ht 6' (1.829 m)   Wt 175 lb (79.4 kg)   BMI 23.73 kg/m  Wt Readings from Last 3 Encounters:  03/04/21 175 lb (79.4 kg)  03/14/20 173 lb 6.4 oz (78.7 kg)  08/30/16 165 lb (74.8 kg)     Health Maintenance Due  Topic Date  Due   Hepatitis C Screening  Never done    There are no preventive care reminders to display for this patient.  Lab Results  Component Value Date   TSH 1.99 03/07/2020   Lab Results  Component Value Date   WBC 5.6 03/07/2020   HGB 14.0 03/07/2020   HCT 41.1 03/07/2020   MCV 86.0 03/07/2020   PLT 217 03/07/2020   Lab Results  Component Value Date   NA 138 03/07/2020   K 3.9 03/07/2020   CO2 25 03/07/2020   GLUCOSE 94 03/07/2020   BUN 14 03/07/2020   CREATININE 0.90 03/07/2020   BILITOT 0.8 03/07/2020   ALKPHOS 45 07/04/2016   AST 22 03/07/2020   ALT 12 03/07/2020   PROT 7.7 03/07/2020   ALBUMIN 4.6 07/04/2016   CALCIUM 9.8 03/07/2020   ANIONGAP 8 07/04/2016   Lab Results  Component Value Date   CHOL 179 03/07/2020   Lab Results  Component Value Date   HDL 57 03/07/2020   Lab Results  Component Value Date   LDLCALC 105 (H) 03/07/2020   Lab Results  Component Value Date   TRIG 78 03/07/2020   Lab Results  Component Value Date   CHOLHDL 3.1 03/07/2020   No results found for: HGBA1C    Assessment & Plan:   Problem List Items Addressed This Visit       Musculoskeletal and Integument   Ankle fracture, right, open type I or II, initial encounter    Patient ankle is much better there is no swelling        Other   Annual physical exam    General physical examination is normal patient denies any chest pain or shortness of breath there is no paroxysmal nocturnal dyspnea, he does not smoke does not drink.  Heart is regular chest is clear abdominal soft nontender without any hepatosplenomegaly peripheral edema no calf tenderness rectal examination was performed.      Relevant Orders   CBC with Differential/Platelet   COMPLETE METABOLIC PANEL WITH GFR   Lipid panel   TSH   Other Visit Diagnoses     Need for hepatitis C screening test    -  Primary   Relevant Orders   Hepatitis C Antibody       No orders of the defined types were placed in this  encounter.   Follow-up: No follow-ups on file.    Corky Downs, MD

## 2021-03-05 LAB — CBC WITH DIFFERENTIAL/PLATELET
Absolute Monocytes: 367 cells/uL (ref 200–950)
Basophils Absolute: 20 cells/uL (ref 0–200)
Basophils Relative: 0.4 %
Eosinophils Absolute: 92 cells/uL (ref 15–500)
Eosinophils Relative: 1.8 %
HCT: 38.9 % (ref 38.5–50.0)
Hemoglobin: 13.1 g/dL — ABNORMAL LOW (ref 13.2–17.1)
Lymphs Abs: 1596 cells/uL (ref 850–3900)
MCH: 30.2 pg (ref 27.0–33.0)
MCHC: 33.7 g/dL (ref 32.0–36.0)
MCV: 89.6 fL (ref 80.0–100.0)
MPV: 10.6 fL (ref 7.5–12.5)
Monocytes Relative: 7.2 %
Neutro Abs: 3024 cells/uL (ref 1500–7800)
Neutrophils Relative %: 59.3 %
Platelets: 239 10*3/uL (ref 140–400)
RBC: 4.34 10*6/uL (ref 4.20–5.80)
RDW: 13.1 % (ref 11.0–15.0)
Total Lymphocyte: 31.3 %
WBC: 5.1 10*3/uL (ref 3.8–10.8)

## 2021-03-05 LAB — COMPLETE METABOLIC PANEL WITH GFR
AG Ratio: 1.7 (calc) (ref 1.0–2.5)
ALT: 11 U/L (ref 9–46)
AST: 19 U/L (ref 10–40)
Albumin: 4.4 g/dL (ref 3.6–5.1)
Alkaline phosphatase (APISO): 59 U/L (ref 36–130)
BUN: 10 mg/dL (ref 7–25)
CO2: 21 mmol/L (ref 20–32)
Calcium: 9.1 mg/dL (ref 8.6–10.3)
Chloride: 105 mmol/L (ref 98–110)
Creat: 0.91 mg/dL (ref 0.60–1.26)
Globulin: 2.6 g/dL (calc) (ref 1.9–3.7)
Glucose, Bld: 93 mg/dL (ref 65–99)
Potassium: 4.2 mmol/L (ref 3.5–5.3)
Sodium: 140 mmol/L (ref 135–146)
Total Bilirubin: 0.6 mg/dL (ref 0.2–1.2)
Total Protein: 7 g/dL (ref 6.1–8.1)
eGFR: 112 mL/min/{1.73_m2} (ref >=60)

## 2021-03-05 LAB — HEPATITIS C ANTIBODY
Hepatitis C Ab: NONREACTIVE
SIGNAL TO CUT-OFF: <0.02 (ref <1.00)

## 2021-03-05 LAB — LIPID PANEL
Cholesterol: 160 mg/dL (ref <200)
HDL: 58 mg/dL (ref >=40)
LDL Cholesterol (Calc): 87 mg/dL (calc)
Non-HDL Cholesterol (Calc): 102 mg/dL (calc) (ref <130)
Total CHOL/HDL Ratio: 2.8 (calc) (ref <5.0)
Triglycerides: 68 mg/dL (ref <150)

## 2021-03-05 LAB — TSH: TSH: 2.99 mIU/L (ref 0.40–4.50)

## 2021-10-06 ENCOUNTER — Ambulatory Visit: Payer: BC Managed Care – PPO | Admitting: Internal Medicine

## 2021-10-06 ENCOUNTER — Encounter: Payer: Self-pay | Admitting: Internal Medicine

## 2021-10-06 VITALS — BP 140/96 | HR 59 | Ht 72.0 in | Wt 179.3 lb

## 2021-10-06 DIAGNOSIS — F419 Anxiety disorder, unspecified: Secondary | ICD-10-CM | POA: Diagnosis not present

## 2021-10-06 DIAGNOSIS — S82891B Other fracture of right lower leg, initial encounter for open fracture type I or II: Secondary | ICD-10-CM

## 2021-10-06 DIAGNOSIS — R079 Chest pain, unspecified: Secondary | ICD-10-CM

## 2021-10-06 DIAGNOSIS — R0782 Intercostal pain: Secondary | ICD-10-CM | POA: Diagnosis not present

## 2021-10-06 MED ORDER — CITALOPRAM HYDROBROMIDE 10 MG PO TABS: 10.0000 mg | ORAL_TABLET | Freq: Every day | ORAL | 3 refills | Status: DC

## 2021-10-06 NOTE — Assessment & Plan Note (Signed)
Stable at the present time. 

## 2021-10-06 NOTE — Assessment & Plan Note (Signed)
-   Patient experiencing high levels of anxiety.  - Encouraged patient to engage in relaxing activities like yoga, meditation, journaling, going for a walk, or participating in a hobby.  - Encouraged patient to reach out to trusted friends or family members about recent struggles, Patient was advised to read A book, how to stop worrying and start living, it is good book to read to control  the stress  

## 2021-10-06 NOTE — Assessment & Plan Note (Addendum)
Patient came for evaluation of  chest pain.  Physical examination is unremarkable heart is regular without any murmur chest clear abdomen is soft nontender  . .  I told the patient that we will schedule him for an echocardiogram and a stress test to further evaluate.  The symptomatology

## 2021-10-06 NOTE — Progress Notes (Addendum)
Established Patient Office Visit  Subjective:  Patient ID: Shane Marshall, male    DOB: Jan 11, 1986  Age: 36 y.o. MRN: [id number]  CC:  Chief Complaint  Patient presents with   Chest Pain    Patient reports chest pain that started approximately 1 week ago.     Chest Pain  This is a recurrent problem. The current episode started in the past 7 days. The problem occurs daily. The problem has been waxing and waning. The pain is present in the substernal region. The pain is at a severity of 2/10. The pain is mild. The quality of the pain is described as tightness. Associated symptoms include diaphoresis and palpitations. Pertinent negatives include no abdominal pain, back pain, cough, dizziness, exertional chest pressure, fever, headaches, irregular heartbeat, lower extremity edema, nausea, near-syncope or syncope. There are no known risk factors.  Pertinent negatives for past medical history include no arrhythmia, no CAD, no rheumatic fever and no seizures.    Shane Marshall presents for   No past medical history on file.  Past Surgical History:  Procedure Laterality Date   FRACTURE SURGERY     Right tib/fib 2014   HARDWARE REMOVAL Right 08/30/2016   Procedure: HARDWARE REMOVAL OF RIGHT MEDIAL TIBIA PLATE, 4 SCREWS;  Surgeon: Hessie Knows, MD;  Location: [facility];  Service: Orthopedics;  Laterality: Right;   I & D EXTREMITY Right 07/05/2016   Procedure: IRRIGATION AND DEBRIDEMENT EXTREMITY;  Surgeon: Hessie Knows, MD;  Location: [facility];  Service: Orthopedics;  Laterality: Right;   I & D EXTREMITY Right 08/30/2016   Procedure: IRRIGATION AND DEBRIDEMENT EXTREMITY (ANKLE);  Surgeon: Hessie Knows, MD;  Location: [facility];  Service: Orthopedics;  Laterality: Right;   ORIF ANKLE FRACTURE Right 07/05/2016   Procedure: OPEN REDUCTION INTERNAL FIXATION (ORIF) ANKLE FRACTURE;  Surgeon: Hessie Knows, MD;  Location: [facility];  Service: Orthopedics;  Laterality: Right;    No family history on  file.  Social History   Socioeconomic History   Marital status: Single    Spouse name: Not on file   Number of children: Not on file   Years of education: Not on file   Highest education level: Not on file  Occupational History   Not on file  Tobacco Use   Smoking status: Former   Smokeless tobacco: Current    Types: Snuff  Substance and Sexual Activity   Alcohol use: Yes    Comment: social   Drug use: Not on file    Comment: dip smokeless tobacco   Sexual activity: Not on file  Other Topics Concern   Not on file  Social History Narrative   Not on file   Social Determinants of Health   Financial Resource Strain: Not on file  Food Insecurity: Not on file  Transportation Needs: Not on file  Physical Activity: Not on file  Stress: Not on file  Social Connections: Not on file  Intimate Partner Violence: Not on file     Current Outpatient Medications:    citalopram (CELEXA) 10 MG tablet, Take 1 tablet (10 mg total) by mouth daily., Disp: 30 tablet, Rfl: 3   No Known Allergies  ROS Review of Systems  Constitutional:  Positive for diaphoresis. Negative for fever.  Respiratory:  Negative for cough.   Cardiovascular:  Positive for chest pain and palpitations. Negative for syncope and near-syncope.  Gastrointestinal:  Negative for abdominal pain and nausea.  Musculoskeletal:  Negative for back pain.  Neurological:  Negative for  dizziness, seizures and headaches.      Objective:    Physical Exam Constitutional:      Appearance: He is well-developed.     Interventions: He is not intubated. HENT:     Head: Normocephalic.  Eyes:     Pupils: Pupils are equal, round, and reactive to light.  Cardiovascular:     Rate and Rhythm: Normal rate and regular rhythm.  Pulmonary:     Effort: No tachypnea, bradypnea, accessory muscle usage, prolonged expiration, respiratory distress or retractions. He is not intubated.  Abdominal:     General: There is no distension.      Palpations: Abdomen is soft.  Musculoskeletal:     Cervical back: Normal range of motion.  Neurological:     Mental Status: He is alert.     BP (!) 140/96   Pulse (!) 59   Ht 6' (1.829 m)   Wt 179 lb 4.8 oz (81.3 kg)   BMI 24.32 kg/m  Wt Readings from Last 3 Encounters:  10/06/21 179 lb 4.8 oz (81.3 kg)  03/04/21 175 lb (79.4 kg)  03/14/20 173 lb 6.4 oz (78.7 kg)     Health Maintenance Due  Topic Date Due   COVID-19 Vaccine (1) Never done    There are no preventive care reminders to display for this patient.  Lab Results  Component Value Date   TSH 2.99 03/04/2021   Lab Results  Component Value Date   WBC 5.1 03/04/2021   HGB 13.1 (L) 03/04/2021   HCT 38.9 03/04/2021   MCV 89.6 03/04/2021   PLT 239 03/04/2021   Lab Results  Component Value Date   NA 140 03/04/2021   K 4.2 03/04/2021   CO2 21 03/04/2021   GLUCOSE 93 03/04/2021   BUN 10 03/04/2021   CREATININE 0.91 03/04/2021   BILITOT 0.6 03/04/2021   ALKPHOS 45 07/04/2016   AST 19 03/04/2021   ALT 11 03/04/2021   PROT 7.0 03/04/2021   ALBUMIN 4.6 07/04/2016   CALCIUM 9.1 03/04/2021   ANIONGAP 8 07/04/2016   EGFR 112 03/04/2021   Lab Results  Component Value Date   CHOL 160 03/04/2021   Lab Results  Component Value Date   HDL 58 03/04/2021   Lab Results  Component Value Date   LDLCALC 87 03/04/2021   Lab Results  Component Value Date   TRIG 68 03/04/2021   Lab Results  Component Value Date   CHOLHDL 2.8 03/04/2021   No results found for: "HGBA1C"    Assessment & Plan:   Problem List Items Addressed This Visit       Musculoskeletal and Integument   Ankle fracture, right, open type I or II, initial encounter    Stable at the present time        Other   Chest pain - Primary    Patient came for evaluation of  chest pain.  Physical examination is unremarkable heart is regular without any murmur chest clear abdomen is soft nontender  . .  I told the patient that we will  schedule him for an echocardiogram and a stress test to further evaluate.  The symptomatology      Relevant Orders   EKG 12-Lead   DG Chest 2 View   CBC with Differential/Platelet   COMPLETE METABOLIC PANEL WITH GFR   TSH   Anxiety    - Patient experiencing high levels of anxiety.  - Encouraged patient to engage in relaxing activities like yoga, meditation, journaling,  going for a walk, or participating in a hobby.  - Encouraged patient to reach out to trusted friends or family members about recent struggles, Patient was advised to read A book, how to stop worrying and start living, it is good book to read to control  the stress       Relevant Medications   citalopram (CELEXA) 10 MG tablet    Meds ordered this encounter  Medications   citalopram (CELEXA) 10 MG tablet    Sig: Take 1 tablet (10 mg total) by mouth daily.    Dispense:  30 tablet    Refill:  3  Report of the electrocardiogram. Normal sinus rhythm #2 no acute changes were noted  Follow-up: No follow-ups on file.    Cletis Athens, MD

## 2021-10-07 ENCOUNTER — Other Ambulatory Visit: Payer: BC Managed Care – PPO

## 2021-10-07 LAB — COMPLETE METABOLIC PANEL WITH GFR
AG Ratio: 2.4 (calc) (ref 1.0–2.5)
ALT: 20 U/L (ref 9–46)
AST: 26 U/L (ref 10–40)
Albumin: 5 g/dL (ref 3.6–5.1)
Alkaline phosphatase (APISO): 65 U/L (ref 36–130)
BUN: 11 mg/dL (ref 7–25)
CO2: 28 mmol/L (ref 20–32)
Calcium: 9.6 mg/dL (ref 8.6–10.3)
Chloride: 103 mmol/L (ref 98–110)
Creat: 0.85 mg/dL (ref 0.60–1.26)
Globulin: 2.1 g/dL (calc) (ref 1.9–3.7)
Glucose, Bld: 88 mg/dL (ref 65–99)
Potassium: 4.4 mmol/L (ref 3.5–5.3)
Sodium: 141 mmol/L (ref 135–146)
Total Bilirubin: 0.9 mg/dL (ref 0.2–1.2)
Total Protein: 7.1 g/dL (ref 6.1–8.1)
eGFR: 115 mL/min/{1.73_m2} (ref >=60)

## 2021-10-07 LAB — CBC WITH DIFFERENTIAL/PLATELET
Absolute Monocytes: 465 cells/uL (ref 200–950)
Basophils Absolute: 17 cells/uL (ref 0–200)
Basophils Relative: 0.2 %
Eosinophils Absolute: 100 cells/uL (ref 15–500)
Eosinophils Relative: 1.2 %
HCT: 39 % (ref 38.5–50.0)
Hemoglobin: 13.1 g/dL — ABNORMAL LOW (ref 13.2–17.1)
Lymphs Abs: 1195 cells/uL (ref 850–3900)
MCH: 30.7 pg (ref 27.0–33.0)
MCHC: 33.6 g/dL (ref 32.0–36.0)
MCV: 91.3 fL (ref 80.0–100.0)
MPV: 11 fL (ref 7.5–12.5)
Monocytes Relative: 5.6 %
Neutro Abs: 6524 cells/uL (ref 1500–7800)
Neutrophils Relative %: 78.6 %
Platelets: 205 10*3/uL (ref 140–400)
RBC: 4.27 10*6/uL (ref 4.20–5.80)
RDW: 12.8 % (ref 11.0–15.0)
Total Lymphocyte: 14.4 %
WBC: 8.3 10*3/uL (ref 3.8–10.8)

## 2021-10-07 LAB — TSH: TSH: 2.95 mIU/L (ref 0.40–4.50)

## 2021-10-12 ENCOUNTER — Other Ambulatory Visit (INDEPENDENT_AMBULATORY_CARE_PROVIDER_SITE_OTHER): Payer: BC Managed Care – PPO

## 2021-10-12 DIAGNOSIS — F419 Anxiety disorder, unspecified: Secondary | ICD-10-CM

## 2021-10-12 DIAGNOSIS — R079 Chest pain, unspecified: Secondary | ICD-10-CM | POA: Diagnosis not present

## 2021-10-12 NOTE — Assessment & Plan Note (Signed)
-   Patient experiencing high levels of anxiety.  - Encouraged patient to engage in relaxing activities like yoga, meditation, journaling, going for a walk, or participating in a hobby.  - Encouraged patient to reach out to trusted friends or family members about recent struggles, Patient was advised to read A book, how to stop worrying and start living, it is good book to read to control  the stress  

## 2021-10-12 NOTE — Progress Notes (Unsigned)
Established Patient Office Visit  Subjective:  Patient ID: Shane Marshall, male    DOB: [birth year]  Age: 36 y.o. MRN: [id number]  CC: No chief complaint on file.   HPI  Shane Marshall presents for echo for chest pain  History reviewed. No pertinent past medical history.  Past Surgical History:  Procedure Laterality Date   FRACTURE SURGERY     Right tib/fib 2014   HARDWARE REMOVAL Right 08/30/2016   Procedure: HARDWARE REMOVAL OF RIGHT MEDIAL TIBIA PLATE, 4 SCREWS;  Surgeon: Hessie Knows, MD;  Location: [facility];  Service: Orthopedics;  Laterality: Right;   I & D EXTREMITY Right 07/05/2016   Procedure: IRRIGATION AND DEBRIDEMENT EXTREMITY;  Surgeon: Hessie Knows, MD;  Location: [facility];  Service: Orthopedics;  Laterality: Right;   I & D EXTREMITY Right 08/30/2016   Procedure: IRRIGATION AND DEBRIDEMENT EXTREMITY (ANKLE);  Surgeon: Hessie Knows, MD;  Location: [facility];  Service: Orthopedics;  Laterality: Right;   ORIF ANKLE FRACTURE Right 07/05/2016   Procedure: OPEN REDUCTION INTERNAL FIXATION (ORIF) ANKLE FRACTURE;  Surgeon: Hessie Knows, MD;  Location: [facility];  Service: Orthopedics;  Laterality: Right;    History reviewed. No pertinent family history.  Social History   Socioeconomic History   Marital status: Single    Spouse name: Not on file   Number of children: Not on file   Years of education: Not on file   Highest education level: Not on file  Occupational History   Not on file  Tobacco Use   Smoking status: Former   Smokeless tobacco: Current    Types: Snuff  Substance and Sexual Activity   Alcohol use: Yes    Comment: social   Drug use: Not on file    Comment: dip smokeless tobacco   Sexual activity: Not on file  Other Topics Concern   Not on file  Social History Narrative   Not on file   Social Determinants of Health   Financial Resource Strain: Not on file  Food Insecurity: Not on file  Transportation Needs: Not on file  Physical Activity: Not on  file  Stress: Not on file  Social Connections: Not on file  Intimate Partner Violence: Not on file     Current Outpatient Medications:    citalopram (CELEXA) 10 MG tablet, Take 1 tablet (10 mg total) by mouth daily., Disp: 30 tablet, Rfl: 3   No Known Allergies  ROS Review of Systems  Constitutional: Negative.   HENT: Negative.    Eyes: Negative.   Respiratory: Negative.    Cardiovascular:  Positive for chest pain.  Gastrointestinal: Negative.   Endocrine: Negative.   Genitourinary: Negative.   Musculoskeletal: Negative.   Skin: Negative.   Allergic/Immunologic: Negative.   Neurological: Negative.   Hematological: Negative.   Psychiatric/Behavioral: Negative.    All other systems reviewed and are negative.     Objective:    Physical Exam Vitals reviewed.  Constitutional:      Appearance: Normal appearance.  HENT:     Mouth/Throat:     Mouth: Mucous membranes are moist.  Eyes:     Pupils: Pupils are equal, round, and reactive to light.  Neck:     Vascular: No carotid bruit.  Cardiovascular:     Rate and Rhythm: Normal rate and regular rhythm.     Pulses: Normal pulses.     Heart sounds: Normal heart sounds.  Pulmonary:     Effort: Pulmonary effort is normal.     Breath  sounds: Normal breath sounds.  Abdominal:     General: Bowel sounds are normal.     Palpations: Abdomen is soft. There is no hepatomegaly, splenomegaly or mass.     Tenderness: There is no abdominal tenderness.     Hernia: No hernia is present.  Musculoskeletal:     Cervical back: Neck supple.     Right lower leg: No edema.     Left lower leg: No edema.  Skin:    Findings: No rash.  Neurological:     Mental Status: He is alert and oriented to person, place, and time.     Motor: No weakness.  Psychiatric:        Mood and Affect: Mood normal.        Behavior: Behavior normal.     There were no vitals taken for this visit. Wt Readings from Last 3 Encounters:  10/06/21 179 lb 4.8 oz  (81.3 kg)  03/04/21 175 lb (79.4 kg)  03/14/20 173 lb 6.4 oz (78.7 kg)     Health Maintenance Due  Topic Date Due   COVID-19 Vaccine (1) Never done    There are no preventive care reminders to display for this patient.  Lab Results  Component Value Date   TSH 2.95 10/06/2021   Lab Results  Component Value Date   WBC 8.3 10/06/2021   HGB 13.1 (L) 10/06/2021   HCT 39.0 10/06/2021   MCV 91.3 10/06/2021   PLT 205 10/06/2021   Lab Results  Component Value Date   NA 141 10/06/2021   K 4.4 10/06/2021   CO2 28 10/06/2021   GLUCOSE 88 10/06/2021   BUN 11 10/06/2021   CREATININE 0.85 10/06/2021   BILITOT 0.9 10/06/2021   ALKPHOS 45 07/04/2016   AST 26 10/06/2021   ALT 20 10/06/2021   PROT 7.1 10/06/2021   ALBUMIN 4.6 07/04/2016   CALCIUM 9.6 10/06/2021   ANIONGAP 8 07/04/2016   EGFR 115 10/06/2021   Lab Results  Component Value Date   CHOL 160 03/04/2021   Lab Results  Component Value Date   HDL 58 03/04/2021   Lab Results  Component Value Date   LDLCALC 87 03/04/2021   Lab Results  Component Value Date   TRIG 68 03/04/2021   Lab Results  Component Value Date   CHOLHDL 2.8 03/04/2021   No results found for: "HGBA1C"    Assessment & Plan:  [facility] [address] [place], [place] [address] Phone: [phone] Fax:  [phone]  Transthoracic Echocardiogram Note  Shane Marshall [id number] 1985/06/15  Procedure: Transthoracic Echocardiogram Indications: Chest pain Verbal Consent: Obtained  Procedure Details   Technical quality: good  Resting Measurements: Within normal range  Left Ventrical: Size is normal  Mitral Valve: Mitral valve is normal both anterior and posterior leaflets  Aortic Valve: Normal  Tricuspid Valve: Tricuspid valve is normal  Pulmonic Valve: Pulmonary valve not seen  Left Atrium/ Left atrial appendage: Normal no blood clots were noted  Atrial septum:   Aorta: Partiallyvisualized aorta is  normal   Complications: No apparent complications Patient did  tolerate procedure well.  Cletis Athens, MD    No orders of the defined types were placed in this encounter.   Follow-up: No follow-ups on file.    Cletis Athens, MD

## 2021-10-12 NOTE — Assessment & Plan Note (Signed)
Patient complained of chest pain not definitely related with exertion we will get an echocardiogram to evaluate left ventricular function to make sure he does not have underlying hypertrophic cardiomyopathy.

## 2021-10-13 ENCOUNTER — Other Ambulatory Visit: Payer: BC Managed Care – PPO

## 2021-10-21 ENCOUNTER — Other Ambulatory Visit: Payer: BC Managed Care – PPO

## 2022-03-12 ENCOUNTER — Ambulatory Visit (INDEPENDENT_AMBULATORY_CARE_PROVIDER_SITE_OTHER): Payer: BC Managed Care – PPO | Admitting: Nurse Practitioner

## 2022-03-12 ENCOUNTER — Encounter: Payer: Self-pay | Admitting: Nurse Practitioner

## 2022-03-12 VITALS — BP 112/78 | HR 72 | Ht 72.0 in | Wt 184.1 lb

## 2022-03-12 DIAGNOSIS — Z0000 Encounter for general adult medical examination without abnormal findings: Secondary | ICD-10-CM

## 2022-03-12 NOTE — Progress Notes (Unsigned)
Established Patient Office Visit  Subjective:  Patient ID: Shane Marshall, male    DOB: 1985-11-05  Age: 36 y.o. MRN: [id number]  CC: No chief complaint on file.    HPI  Shane Marshall presents to the clinic for his/her annual physical exam.  Flu: no Tetanus: 2021 COVID: no Dentist: every 6 months  Eye examination: 2023 Exercise: some  Diet: Patient does eat meat. Patient consumes fruits and veggies. Patient eat some fried food. Patient drinks water and black coffee.  HPI   No past medical history on file.  Past Surgical History:  Procedure Laterality Date   FRACTURE SURGERY     Right tib/fib 2014   HARDWARE REMOVAL Right 08/30/2016   Procedure: HARDWARE REMOVAL OF RIGHT MEDIAL TIBIA PLATE, 4 SCREWS;  Surgeon: Hessie Knows, MD;  Location: [facility];  Service: Orthopedics;  Laterality: Right;   I & D EXTREMITY Right 07/05/2016   Procedure: IRRIGATION AND DEBRIDEMENT EXTREMITY;  Surgeon: Hessie Knows, MD;  Location: [facility];  Service: Orthopedics;  Laterality: Right;   I & D EXTREMITY Right 08/30/2016   Procedure: IRRIGATION AND DEBRIDEMENT EXTREMITY (ANKLE);  Surgeon: Hessie Knows, MD;  Location: [facility];  Service: Orthopedics;  Laterality: Right;   ORIF ANKLE FRACTURE Right 07/05/2016   Procedure: OPEN REDUCTION INTERNAL FIXATION (ORIF) ANKLE FRACTURE;  Surgeon: Hessie Knows, MD;  Location: [facility];  Service: Orthopedics;  Laterality: Right;    No family history on file.  Social History   Socioeconomic History   Marital status: Single    Spouse name: Not on file   Number of children: Not on file   Years of education: Not on file   Highest education level: Not on file  Occupational History   Not on file  Tobacco Use   Smoking status: Former   Smokeless tobacco: Former    Types: Snuff  Vaping Use   Vaping Use: Former  Substance and Sexual Activity   Alcohol use: Yes    Comment: once a month   Drug use: Never    Comment: dip smokeless tobacco   Sexual activity:  Yes  Other Topics Concern   Not on file  Social History Narrative   Not on file   Social Determinants of Health   Financial Resource Strain: Not on file  Food Insecurity: Not on file  Transportation Needs: Not on file  Physical Activity: Not on file  Stress: Not on file  Social Connections: Not on file  Intimate Partner Violence: Not on file     Outpatient Medications Prior to Visit  Medication Sig Dispense Refill   citalopram (CELEXA) 10 MG tablet Take 1 tablet (10 mg total) by mouth daily. 30 tablet 3   No facility-administered medications prior to visit.    No Known Allergies  ROS Review of Systems  Constitutional: Negative.   HENT: Negative.    Eyes: Negative.   Respiratory:  Negative for cough.   Cardiovascular:  Negative for chest pain and palpitations.  Gastrointestinal: Negative.   Genitourinary: Negative.   Musculoskeletal: Negative.   Neurological: Negative.   Psychiatric/Behavioral: Negative.        Objective:    Physical Exam Constitutional:      Appearance: Normal appearance. He is normal weight.  HENT:     Head: Normocephalic.     Right Ear: Tympanic membrane normal.     Left Ear: Tympanic membrane normal.     Nose: Nose normal.     Mouth/Throat:  Mouth: Mucous membranes are moist.  Eyes:     Conjunctiva/sclera: Conjunctivae normal.     Pupils: Pupils are equal, round, and reactive to light.  Cardiovascular:     Rate and Rhythm: Normal rate and regular rhythm.     Pulses: Normal pulses.     Heart sounds: Normal heart sounds. No murmur heard.    No friction rub.  Pulmonary:     Effort: Pulmonary effort is normal.     Breath sounds: Normal breath sounds.  Abdominal:     General: Abdomen is flat. Bowel sounds are normal. There is no distension.     Hernia: No hernia is present.  Musculoskeletal:        General: No deformity.     Left lower leg: No edema.  Skin:    General: Skin is warm.     Capillary Refill: Capillary refill takes  less than 2 seconds.  Neurological:     General: No focal deficit present.     Mental Status: He is alert and oriented to person, place, and time. Mental status is at baseline.  Psychiatric:        Mood and Affect: Mood normal.        Behavior: Behavior normal.        Thought Content: Thought content normal.        Judgment: Judgment normal.     BP 112/78 (BP Location: Left Arm, Patient Position: Sitting)   Pulse 72   Ht 6' (1.829 m)   Wt 184 lb 1 oz (83.5 kg)   SpO2 99%   BMI 24.96 kg/m  Wt Readings from Last 3 Encounters:  03/12/22 184 lb 1 oz (83.5 kg)  10/06/21 179 lb 4.8 oz (81.3 kg)  03/04/21 175 lb (79.4 kg)     Health Maintenance  Topic Date Due   COVID-19 Vaccine (1) Never done   INFLUENZA VACCINE  Never done   DTaP/Tdap/Td (2 - Td or Tdap) 03/30/2027   Hepatitis C Screening  Completed   HIV Screening  Completed   HPV VACCINES  Aged Out    There are no preventive care reminders to display for this patient.  Lab Results  Component Value Date   TSH 2.95 10/06/2021   Lab Results  Component Value Date   WBC 8.3 10/06/2021   HGB 13.1 (L) 10/06/2021   HCT 39.0 10/06/2021   MCV 91.3 10/06/2021   PLT 205 10/06/2021   Lab Results  Component Value Date   NA 141 10/06/2021   K 4.4 10/06/2021   CO2 28 10/06/2021   GLUCOSE 88 10/06/2021   BUN 11 10/06/2021   CREATININE 0.85 10/06/2021   BILITOT 0.9 10/06/2021   ALKPHOS 45 07/04/2016   AST 26 10/06/2021   ALT 20 10/06/2021   PROT 7.1 10/06/2021   ALBUMIN 4.6 07/04/2016   CALCIUM 9.6 10/06/2021   ANIONGAP 8 07/04/2016   EGFR 115 10/06/2021   Lab Results  Component Value Date   CHOL 160 03/04/2021   Lab Results  Component Value Date   HDL 58 03/04/2021   Lab Results  Component Value Date   LDLCALC 87 03/04/2021   Lab Results  Component Value Date   TRIG 68 03/04/2021   Lab Results  Component Value Date   CHOLHDL 2.8 03/04/2021   No results found for: "HGBA1C"    Assessment & Plan:    Problem List Items Addressed This Visit   None    No orders of the defined types  were placed in this encounter.    Follow-up: No follow-ups on file.    Theresia Lo, NP

## 2022-03-17 ENCOUNTER — Encounter: Payer: Self-pay | Admitting: Nurse Practitioner

## 2022-03-17 NOTE — Assessment & Plan Note (Signed)
Encouraged patient to consume a balanced diet and regular exercise regimen. Advised to see an eye doctor and dentist annually.   

## 2023-03-04 DIAGNOSIS — Z0000 Encounter for general adult medical examination without abnormal findings: Secondary | ICD-10-CM | POA: Diagnosis not present

## 2023-03-09 DIAGNOSIS — Z0000 Encounter for general adult medical examination without abnormal findings: Secondary | ICD-10-CM | POA: Diagnosis not present

## 2023-03-09 DIAGNOSIS — E039 Hypothyroidism, unspecified: Secondary | ICD-10-CM | POA: Diagnosis not present

## 2023-03-09 DIAGNOSIS — L409 Psoriasis, unspecified: Secondary | ICD-10-CM | POA: Diagnosis not present

## 2023-03-09 DIAGNOSIS — E785 Hyperlipidemia, unspecified: Secondary | ICD-10-CM | POA: Diagnosis not present

## 2023-06-14 DIAGNOSIS — L400 Psoriasis vulgaris: Secondary | ICD-10-CM | POA: Diagnosis not present

## 2023-10-17 DIAGNOSIS — L400 Psoriasis vulgaris: Secondary | ICD-10-CM | POA: Diagnosis not present

## 2023-12-14 ENCOUNTER — Ambulatory Visit: Admitting: Family Medicine

## 2023-12-14 VITALS — BP 117/93 | HR 63 | Resp 16 | Ht 72.0 in | Wt 178.8 lb

## 2023-12-14 DIAGNOSIS — D649 Anemia, unspecified: Secondary | ICD-10-CM | POA: Diagnosis not present

## 2023-12-14 DIAGNOSIS — F419 Anxiety disorder, unspecified: Secondary | ICD-10-CM

## 2023-12-14 DIAGNOSIS — Z1329 Encounter for screening for other suspected endocrine disorder: Secondary | ICD-10-CM

## 2023-12-14 DIAGNOSIS — E039 Hypothyroidism, unspecified: Secondary | ICD-10-CM

## 2023-12-14 DIAGNOSIS — Z13228 Encounter for screening for other metabolic disorders: Secondary | ICD-10-CM | POA: Diagnosis not present

## 2023-12-14 DIAGNOSIS — Z136 Encounter for screening for cardiovascular disorders: Secondary | ICD-10-CM

## 2023-12-14 DIAGNOSIS — Z130 Encounter for screening for diseases of the blood and blood-forming organs and certain disorders involving the immune mechanism: Secondary | ICD-10-CM | POA: Diagnosis not present

## 2023-12-14 DIAGNOSIS — Z0000 Encounter for general adult medical examination without abnormal findings: Secondary | ICD-10-CM | POA: Diagnosis not present

## 2023-12-14 NOTE — Progress Notes (Signed)
 New patient visit   Patient: Shane Marshall   DOB: 19-Apr-1985   38 y.o. Male  MRN: [id number] Visit Date: 12/14/2023  Today's healthcare provider: LAURAINE LOISE BUOY, DO   Chief Complaint  Patient presents with   Establish Care    New patient here to establish care and to follow-up on thyroid . Needs refill. Would like to have his physical done today for work. Patient declined flu vaccine.   Subjective    Shane Marshall is a 38 y.o. male who presents today as a new patient to establish care.   HPI HPI     Establish Care    Additional comments: New patient here to establish care and to follow-up on thyroid . Needs refill. Would like to have his physical done today for work. Patient declined flu vaccine.      Last edited by Rosas, Joseline E, CMA on 12/14/2023  9:24 AM.      Shane Marshall is a 38 year old male who presents for a routine physical exam and thyroid  medication management.  He has been off his levothyroxine 37.5 mcg since last Friday.  He had 10 pills left approximately 1 month ago and started taking them 1 every 3 days, with the last dose being 12/09/2023.  The medication previously improved his fatigue, which he describes as 'tired a lot, but I feel like that's normal.' He has not been on any other medications recently, though he previously used gabapentin for a broken leg.  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  He has a history of anxiety, which he describes as 'there but controllable overall.' He sleeps well once he falls asleep, though he sometimes has difficulty falling asleep. He occasionally uses melatonin to aid sleep. No abnormal or racing heartbeats.  He is physically active at work and maintains a good diet, influenced by his wife who is a 'health nut.' He drinks alcohol on weekends, sometimes consuming up to six beers, especially during summer  activities like going to the lake. He quit smoking years ago.  Family history is significant for various cancers, including a nephew who died from brain cancer, a mother with breast cancer, and two grandmothers who died from cancer. His father is noted to be very healthy.    Past Medical History:  Diagnosis Date   Anxiety 2022   Thyroid  disease 2024   Past Surgical History:  Procedure Laterality Date   FRACTURE SURGERY     Right tib/fib 2014   HARDWARE REMOVAL Right 08/30/2016   Procedure: HARDWARE REMOVAL OF RIGHT MEDIAL TIBIA PLATE, 4 SCREWS;  Surgeon: Kathlynn Sharper, MD;  Location: [facility];  Service: Orthopedics;  Laterality: Right;   I & D EXTREMITY Right 07/05/2016   Procedure: IRRIGATION AND DEBRIDEMENT EXTREMITY;  Surgeon: Sharper Kathlynn, MD;  Location: [facility];  Service: Orthopedics;  Laterality: Right;   I & D EXTREMITY Right 08/30/2016   Procedure: IRRIGATION AND DEBRIDEMENT EXTREMITY (ANKLE);  Surgeon: Kathlynn Sharper, MD;  Location: [facility];  Service: Orthopedics;  Laterality: Right;   ORIF ANKLE FRACTURE Right 07/05/2016   Procedure: OPEN REDUCTION INTERNAL FIXATION (ORIF) ANKLE FRACTURE;  Surgeon: Sharper Kathlynn, MD;  Location: [facility];  Service: Orthopedics;  Laterality: Right;   Family Status  Relation Name Status   Mother Shane Marshall Alive   [facility] Alive  No partnership data on file  Family History  Problem Relation Age of Onset   Cancer Mother    Cancer Maternal Grandfather    Social History   Socioeconomic History   Marital status: Single    Spouse name: Not on file   Number of children: Not on file   Years of education: Not on file   Highest education level: Bachelor's degree (e.g., BA, AB, BS)  Occupational History   Not on file  Tobacco Use   Smoking status: Former    Current packs/day: 0.00    Types: Cigarettes    Quit date: 03/29/2020    Years since quitting: 3.7   Smokeless tobacco: Former    Types: Snuff  Vaping Use   Vaping status: Former   Substance and Sexual Activity   Alcohol use: Yes    Alcohol/week: 5.0 standard drinks of alcohol    Types: 5 Cans of beer per week    Comment: once a month   Drug use: Never    Comment: dip smokeless tobacco   Sexual activity: Yes    Birth control/protection: None  Other Topics Concern   Not on file  Social History Narrative   Not on file   Social Drivers of Health   Financial Resource Strain: Low Risk  (12/14/2023)   Overall Financial Resource Strain (CARDIA)    Difficulty of Paying Living Expenses: Not hard at all  Food Insecurity: No Food Insecurity (12/14/2023)   Hunger Vital Sign    Worried About Running Out of Food in the Last Year: Never true    Ran Out of Food in the Last Year: Never true  Transportation Needs: No Transportation Needs (12/14/2023)   PRAPARE - Administrator, Civil Service (Medical): No    Lack of Transportation (Non-Medical): No  Physical Activity: Sufficiently Active (12/14/2023)   Exercise Vital Sign    Days of Exercise per Week: 4 days    Minutes of Exercise per Session: 60 min  Stress: Stress Concern Present (12/14/2023)   [employer] of Occupational Health - Occupational Stress Questionnaire    Feeling of Stress: To some extent  Social Connections: Moderately Isolated (12/14/2023)   Social Connection and Isolation Panel    Frequency of Communication with Friends and Family: More than three times a week    Frequency of Social Gatherings with Friends and Family: Twice a week    Attends Religious Services: Never    Database administrator or Organizations: No    Attends Engineer, structural: Not on file    Marital Status: Married   Outpatient Medications Prior to Visit  Medication Sig   [DISCONTINUED] gabapentin (NEURONTIN) 300 MG capsule Take 1 tab in AM, 1 tab afternoon, and 2 tabs at night for 3 days. Then gradually  increase to 2 tabs in AM, 2 tabs afternoon,3 tabs night.   clobetasol ointment (TEMOVATE) 0.05 % Apply  topically.   TIROSINT 37.5 MCG CAPS Take 1 capsule by mouth daily.   No facility-administered medications prior to visit.   No Known Allergies  Immunization History  Administered Date(s) Administered   Tdap 03/29/2017    Health Maintenance  Topic Date Due   Influenza Vaccine  06/26/2024 (Originally 10/28/2023)   COVID-19 Vaccine (1 - 2024-25 season) 12/11/2024 (Originally 11/28/2023)   Hepatitis B Vaccines 19-59 Average Risk (1 of 3 - 19+ 3-dose series) 12/13/2024 (Originally 02/23/2004)   HPV VACCINES (1 - 3-dose SCDM series) 12/13/2024 (Originally 02/23/2012)   DTaP/Tdap/Td (2 - Td or Tdap) 03/30/2027  Hepatitis C Screening  Completed   HIV Screening  Completed   Pneumococcal Vaccine  Aged Out   Meningococcal B Vaccine  Aged Out    Patient Care Team: Benaiah Behan, Lauraine SAILOR, DO as PCP - General (Family Medicine)  Review of Systems  Constitutional:  Positive for fatigue. Negative for appetite change, chills and fever.  HENT:  Negative for congestion, ear pain, hearing loss, nosebleeds and trouble swallowing.   Eyes:  Negative for pain and visual disturbance.  Respiratory:  Negative for cough, chest tightness and shortness of breath.   Cardiovascular:  Negative for chest pain, palpitations and leg swelling.  Gastrointestinal:  Negative for abdominal pain, blood in stool, constipation, diarrhea, nausea and vomiting.  Endocrine: Negative for polydipsia, polyphagia and polyuria.  Genitourinary:  Negative for dysuria and flank pain.  Musculoskeletal:  Positive for back pain (mild). Negative for arthralgias, joint swelling, myalgias and neck stiffness.  Skin:  Negative for color change, rash and wound.  Neurological:  Negative for dizziness, tremors, seizures, speech difficulty, weakness, light-headedness and headaches.  Psychiatric/Behavioral:  Negative for behavioral problems, confusion, decreased concentration, dysphoric mood and sleep disturbance. The patient is not nervous/anxious.    All other systems reviewed and are negative.       Objective    BP (!) 117/93 (BP Location: Left Arm, Patient Position: Sitting, Cuff Size: Normal)   Pulse 63   Resp 16   Ht 6' (1.829 m)   Wt 178 lb 12.8 oz (81.1 kg)   SpO2 100%   BMI 24.25 kg/m     Physical Exam Vitals and nursing note reviewed.  Constitutional:      General: He is awake.     Appearance: Normal appearance.  HENT:     Head: Normocephalic and atraumatic.     Right Ear: Tympanic membrane, ear canal and external ear normal.     Left Ear: Tympanic membrane, ear canal and external ear normal.     Nose: Nose normal.     Mouth/Throat:     Mouth: Mucous membranes are moist.     Pharynx: Oropharynx is clear. No oropharyngeal exudate or posterior oropharyngeal erythema.  Eyes:     General: No scleral icterus.    Extraocular Movements: Extraocular movements intact.     Conjunctiva/sclera: Conjunctivae normal.     Pupils: Pupils are equal, round, and reactive to light.  Neck:     Thyroid : No thyromegaly or thyroid  tenderness.  Cardiovascular:     Rate and Rhythm: Normal rate and regular rhythm.     Pulses: Normal pulses.     Heart sounds: Normal heart sounds.  Pulmonary:     Effort: Pulmonary effort is normal. No tachypnea, bradypnea or respiratory distress.     Breath sounds: Normal breath sounds. No stridor. No wheezing, rhonchi or rales.  Abdominal:     General: Bowel sounds are normal. There is no distension.     Palpations: Abdomen is soft. There is no mass.     Tenderness: There is no abdominal tenderness. There is no guarding.     Hernia: No hernia is present.  Musculoskeletal:     Cervical back: Normal range of motion and neck supple.     Right lower leg: No edema.     Left lower leg: No edema.  Lymphadenopathy:     Cervical: No cervical adenopathy.  Skin:    General: Skin is warm and dry.  Neurological:     Mental Status: He is alert and oriented to person,  place, and time. Mental status is  at baseline.  Psychiatric:        Mood and Affect: Mood normal.        Behavior: Behavior normal.     Depression Screen    12/14/2023    9:19 AM 12/14/2023    9:11 AM 03/04/2021    8:44 AM  PHQ 2/9 Scores  PHQ - 2 Score 0 0 0  PHQ- 9 Score  5    No results found for any visits on 12/14/23.  Assessment & Plan     Encounter for medical examination to establish care  Hypothyroidism, unspecified type -     TSH + free T4  Anxiety  Anemia, unspecified type -     CBC with Differential/Platelet  Encounter for screening for cardiovascular disorders -     Lipid panel  Screening for endocrine, metabolic and immunity disorder -     Comprehensive metabolic panel with GFR     Encounter for medical examination to establish care Declined flu and COVID vaccines. Discussed HPV vaccination before age 39. Likely received hepatitis B vaccine. No significant family history of colon cancer. Advised on alcohol moderation. - Recommend HPV vaccination before age 44 if desired. - Encourage moderation in alcohol consumption, not exceeding two drinks per occasion.  Hypothyroidism Off levothyroxine for one month. Improvement in fatigue noted while on medication. Prefers medication only if necessary.  Anxiety disorder Anxiety manageable. Difficulty falling asleep, uses melatonin occasionally. - Consider melatonin for sleep as needed.    Return in about 1 year (around 12/13/2024) for CPE, Chronic f/u.     I discussed the assessment and treatment plan with the patient  The patient was provided an opportunity to ask questions and all were answered. The patient agreed with the plan and demonstrated an understanding of the instructions.   The patient was advised to call back or seek an in-person evaluation if the symptoms worsen or if the condition fails to improve as anticipated.    LAURAINE LOISE BUOY, DO  [facility] [phone] (phone) [phone] (fax)  [facility]

## 2023-12-15 LAB — COMPREHENSIVE METABOLIC PANEL WITH GFR
ALT: 24 IU/L (ref 0–44)
AST: 34 IU/L (ref 0–40)
Albumin: 5 g/dL (ref 4.1–5.1)
Alkaline Phosphatase: 65 IU/L (ref 47–123)
BUN/Creatinine Ratio: 13 (ref 9–20)
BUN: 12 mg/dL (ref 6–20)
Bilirubin Total: 0.7 mg/dL (ref 0.0–1.2)
CO2: 23 mmol/L (ref 20–29)
Calcium: 9.6 mg/dL (ref 8.7–10.2)
Chloride: 101 mmol/L (ref 96–106)
Creatinine, Ser: 0.9 mg/dL (ref 0.76–1.27)
Globulin, Total: 2.4 g/dL (ref 1.5–4.5)
Glucose: 90 mg/dL (ref 70–99)
Potassium: 4.7 mmol/L (ref 3.5–5.2)
Sodium: 138 mmol/L (ref 134–144)
Total Protein: 7.4 g/dL (ref 6.0–8.5)
eGFR: 112 mL/min/1.73 (ref >59)

## 2023-12-15 LAB — LIPID PANEL
Chol/HDL Ratio: 3.3 ratio (ref 0.0–5.0)
Cholesterol, Total: 207 mg/dL — ABNORMAL HIGH (ref 100–199)
HDL: 62 mg/dL (ref >39)
LDL Chol Calc (NIH): 131 mg/dL — ABNORMAL HIGH (ref 0–99)
Triglycerides: 76 mg/dL (ref 0–149)
VLDL Cholesterol Cal: 14 mg/dL (ref 5–40)

## 2023-12-15 LAB — CBC WITH DIFFERENTIAL/PLATELET
Basophils Absolute: 0 x10E3/uL (ref 0.0–0.2)
Basos: 0 %
EOS (ABSOLUTE): 0.1 x10E3/uL (ref 0.0–0.4)
Eos: 1 %
Hematocrit: 40.2 % (ref 37.5–51.0)
Hemoglobin: 13.6 g/dL (ref 13.0–17.7)
Immature Grans (Abs): 0 x10E3/uL (ref 0.0–0.1)
Immature Granulocytes: 0 %
Lymphocytes Absolute: 1.4 x10E3/uL (ref 0.7–3.1)
Lymphs: 30 %
MCH: 30.6 pg (ref 26.6–33.0)
MCHC: 33.8 g/dL (ref 31.5–35.7)
MCV: 90 fL (ref 79–97)
Monocytes Absolute: 0.4 x10E3/uL (ref 0.1–0.9)
Monocytes: 9 %
Neutrophils Absolute: 2.8 x10E3/uL (ref 1.4–7.0)
Neutrophils: 60 %
Platelets: 234 x10E3/uL (ref 150–450)
RBC: 4.45 x10E6/uL (ref 4.14–5.80)
RDW: 12.7 % (ref 11.6–15.4)
WBC: 4.8 x10E3/uL (ref 3.4–10.8)

## 2023-12-15 LAB — TSH+FREE T4
Free T4: 1.16 ng/dL (ref 0.82–1.77)
TSH: 3.1 u[IU]/mL (ref 0.450–4.500)

## 2023-12-23 ENCOUNTER — Ambulatory Visit: Payer: Self-pay | Admitting: Family Medicine

## 2024-12-13 ENCOUNTER — Encounter: Admitting: Family Medicine
